# Patient Record
Sex: Female | Born: 1960 | Race: White | Hispanic: No | Marital: Single | Smoking: Former smoker
Health system: Southern US, Community
[De-identification: ages and names within clinical notes are randomized; demographics above are authoritative.]

## PROBLEM LIST (undated history)

## (undated) DIAGNOSIS — G4700 Insomnia, unspecified: Secondary | ICD-10-CM

## (undated) DIAGNOSIS — F419 Anxiety disorder, unspecified: Secondary | ICD-10-CM

## (undated) HISTORY — PX: BREAST BIOPSY: SHX20

## (undated) HISTORY — DX: Insomnia, unspecified: G47.00

---

## 2000-07-01 ENCOUNTER — Other Ambulatory Visit: Admission: RE | Admit: 2000-07-01 | Discharge: 2000-07-01 | Payer: Self-pay | Admitting: *Deleted

## 2001-05-21 ENCOUNTER — Encounter: Payer: Self-pay | Admitting: *Deleted

## 2001-05-21 ENCOUNTER — Ambulatory Visit (HOSPITAL_COMMUNITY): Admission: RE | Admit: 2001-05-21 | Discharge: 2001-05-21 | Payer: Self-pay | Admitting: *Deleted

## 2001-10-21 ENCOUNTER — Inpatient Hospital Stay (HOSPITAL_COMMUNITY): Admission: AD | Admit: 2001-10-21 | Discharge: 2001-10-23 | Payer: Self-pay | Admitting: *Deleted

## 2002-03-30 ENCOUNTER — Ambulatory Visit (HOSPITAL_COMMUNITY): Admission: RE | Admit: 2002-03-30 | Discharge: 2002-03-30 | Payer: Self-pay | Admitting: Family Medicine

## 2003-03-23 ENCOUNTER — Emergency Department (HOSPITAL_COMMUNITY): Admission: EM | Admit: 2003-03-23 | Discharge: 2003-03-23 | Payer: Self-pay | Admitting: Emergency Medicine

## 2003-04-19 ENCOUNTER — Ambulatory Visit (HOSPITAL_COMMUNITY): Admission: RE | Admit: 2003-04-19 | Discharge: 2003-04-19 | Payer: Self-pay | Admitting: Obstetrics and Gynecology

## 2004-04-23 ENCOUNTER — Ambulatory Visit (HOSPITAL_COMMUNITY): Admission: RE | Admit: 2004-04-23 | Discharge: 2004-04-23 | Payer: Self-pay | Admitting: Family Medicine

## 2005-07-22 ENCOUNTER — Ambulatory Visit (HOSPITAL_COMMUNITY): Admission: RE | Admit: 2005-07-22 | Discharge: 2005-07-22 | Payer: Self-pay | Admitting: Preventative Medicine

## 2006-02-18 ENCOUNTER — Encounter (HOSPITAL_COMMUNITY): Admission: RE | Admit: 2006-02-18 | Discharge: 2006-03-20 | Payer: Self-pay | Admitting: Preventative Medicine

## 2008-05-09 ENCOUNTER — Ambulatory Visit (HOSPITAL_COMMUNITY): Admission: RE | Admit: 2008-05-09 | Discharge: 2008-05-09 | Payer: Self-pay | Admitting: Family Medicine

## 2010-02-04 ENCOUNTER — Encounter: Payer: Self-pay | Admitting: Obstetrics and Gynecology

## 2010-06-01 NOTE — H&P (Signed)
NAME:  Sarah Juarez, Sarah Juarez                        ACCOUNT NO.:  [id number]   MEDICAL RECORD NO.:  [id number]                   PATIENT TYPE:  INP   LOCATION:  A428                                 FACILITY:  [facility]   PHYSICIAN:  Langley Gauss, MD                  DATE OF BIRTH:  [birth year]   DATE OF ADMISSION:  10/21/2001  DATE OF DISCHARGE:                                HISTORY & PHYSICAL   HISTORY:  A 50 year old gravida 3, para 2 at 39-1/[redacted] weeks gestation who was  admitted for induction of labor.  The patient is noted to live in [place] and  thus will be induced for psychosocial factors.  The patient's prenatal  course has been complicated by an abnormal AFP triple screen.  The patient  was noted to be likewise at high risk due to advanced maternal age.  She was  noted to be at 1/106 risk of Down syndrome.  She did undergo genetic  amniocentesis with final results being 46XX.  The patient was likewise noted  to be smoking about one pack per day at the onset of the pregnancy.  She was  noted to be O+ blood type and negative antibody screen.   ALLERGIES:  No known drug allergies.   PAST MEDICAL HISTORY:  She has two prior vaginal deliveries in 1984 and  1987.  She does state that she does have history of precipitous labor  lasting less than one hour.  Infant's had weighed 5 pounds and 8 ounces and  7 pounds and 5 ounces.  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   SOCIAL HISTORY:  The patient is employed at [employer].  The father  of the infant is named Engineering geologist.  The patient, as stated previously, smoked  about one pack per day at the onset of pregnancy and was noted to decrease  intake during the pregnancy.  The patient does plan on bottle feeding.  She  will be utilizing pediatrician on-call thereafter at [facility].  The  patient would like to initiate oral contraceptives for postpartum birth  control purposes.   PHYSICAL EXAMINATION:  VITAL SIGNS:  Height is 5 feet 9 inches.  Prepregnancy weight 158 pounds, recent weight 186 pounds.  Blood pressure  120/81, pulse rate of 80, respiratory rate 20.  HEENT:  Negative.  No adenopathy.  NECK:  Supple.  Thyroid is nonpalpable.  LUNGS:  Clear.  CARDIOVASCULAR:  Regular rate and rhythm.  ABDOMEN:  Soft and nontender.  No surgical scars are identified.  The  patient is vertex presentation by Leopold's maneuver.  No surgical scars are  identified.  EXTREMITIES:  Normal.  PELVIC:  Normal external genitalia.  No lesions or ulcerations identified.  No vaginal bleeding or leakage of fluids.  Cervix 2 cm dilated, 60% effaced,  -2 station with vertex presentation  confirmed.    ASSESSMENT:  A 39-1/2 week intrauterine pregnancy with favorable cervix.  We  will admit the patient on October 22, 2001, at which time we can proceed with  amniotomy and thereafter utilization of Pitocin as indicated if needed for  induction or augmentation of labor.                                               Langley Gauss, MD    DC/MEDQ  D:  10/21/2001  T:  10/22/2001  Job:  [id number]   cc:   [facility]

## 2010-06-01 NOTE — Op Note (Signed)
NAME:  Sarah Juarez, Sarah Juarez                        ACCOUNT NO.:  [id number]   MEDICAL RECORD NO.:  [id number]                   PATIENT TYPE:  INP   LOCATION:  A428                                 FACILITY:  [facility]   PHYSICIAN:  Langley Gauss, MD                  DATE OF BIRTH:  1960/04/29   DATE OF PROCEDURE:  10/22/2001  DATE OF DISCHARGE:                                 OPERATIVE REPORT   DELIVERY NOTE:  Patient admitted on 10/21/01 at 39-1/[redacted] weeks gestation  presenting in early stages of labor.  Amniotomy was performed at 2 cm  diameter, 60% effaced with the vertex at a 0 station.  The patient did have  a prior history of rapid labor and delivery.  The patient had clear amniotic  fluid.  Fetal heart rate was monitored utilizing a fetal scalp electrode.  The patient had a reassuring fetal heart rate.  Thereafter the patient  progressed rapidly along the labor curve with onset of uncomfortable uterine  contractions.  The patient requested pain medications.  She was treated with  a single dose of 10 mg of IV Nubain.  Thereafter she progressed very  rapidly.  She had stated an interest in an epidural, but she progressed very  rapidly to complete dilatation at which time I was notified.   The patient was then examined and complete dilation was confirmed.  The  patient placed in the dorsal lithotomy position and prepped and draped in  the usual sterile manner.  She pushed very well during  a short second stage  to deliver in a direct OA position.  A small midline episiotomy had been  performed.  A total of 30 cc of 1% lidocaine used in the midline of the  perineal body.  The head delivered atraumatically.  The episiotomy did not  extend.   The mouth and nares were bulb suctioned of clear amniotic fluid.  A nuchal  cord x1 with compression was reduced prior to delivery of the shoulders.  There had been noted some moderate variability decelerations during the  short second stage of labor.   After reduction of the nuchal cord there was a  spontaneous rotation to a right anterior shoulder position.  Expulsive  efforts plus gentle downward traction resulted in delivery of this shoulder  on the pubic symphysis without difficulty.  The umbilical cord was then  milked towards the infant.  The cord was doubly clamped, and cut.  The  infant was placed on the maternal abdomen for immediate bonding purposes.  A  spontaneous and vigorous breathing cry was noted.  Arterial cord gases and  cord blood are then obtained.   Gentle traction on the umbilical cord results in separation which, upon  examination, appears to be an intact placenta with an attached 3-vessel  umbilical cord.  Excellent uterine tone is achieved following delivery of  placenta.  Examination of the  genital tract reveals no lacerations.  The  midline episiotomy has not extended.  This was easily repaired utilizing #0  chromic in a running lock fascia of the vaginal mucosa followed by a 2-layer  closure of #0 chromic on the perineal body.  The patient tolerated the  delivery and repair very well.  She was then taken out of the dorsal  lithotomy position and allowed to bond with the infant.  The patient,  herself, does plan on bottle feeding this infant.                                               Langley Gauss, MD    DC/MEDQ  D:  10/22/2001  T:  10/22/2001  Job:  [id number]

## 2010-06-01 NOTE — Discharge Summary (Signed)
   NAME:  Sarah Juarez, Sarah Juarez                        ACCOUNT NO.:  [id number]   MEDICAL RECORD NO.:  [id number]                   PATIENT TYPE:  INP   LOCATION:  A428                                 FACILITY:  [facility]   PHYSICIAN:  Langley Gauss, MD                  DATE OF BIRTH:  1960-07-08   DATE OF ADMISSION:  10/21/2001  DATE OF DISCHARGE:  10/23/2001                                 DISCHARGE SUMMARY   DIAGNOSES:  A 39 week intrauterine pregnancy admitted in labor.   PROCEDURE PERFORMED:  1. October 22, 2001:  Spontaneous assisted vaginal delivery ______ pound 0     ounce female infant.  2. Midline episiotomy and repair.   ANALGESIA:  The patient received IV Nubain only during the course of labor.  She had been interested in placement of an epidural but this was not  possible or feasible due to rapid progress in active phase of labor.   Discharged on October 23, 2001.  Given a copy of standard discharge  instructions.  Follow up in the office in four weeks' time at which time she  is likely utilize Ortho-Evra patch.   DISCHARGE MEDICATIONS:  Tylox number 30 for significant perineal pain  associated with episiotomy.   The patient is bottle feeding at time of discharge.   CURRENT LABORATORY STUDIES:  Hemoglobin 12.1, hematocrit 34.9, white count  12.1.   HOSPITAL COURSE:  See previous dictations.  The patient has done well  postpartum.  Had no postpartum complications.  The patient bonded well with  the infant.  Was able to ambulate and void without difficulty.  Thus,  discharged home on October 23, 2001.  Given a copy of standardized discharge  instructions.                                               Langley Gauss, MD    DC/MEDQ  D:  10/27/2001  T:  10/28/2001  Job:  [id number]

## 2011-10-26 ENCOUNTER — Encounter (HOSPITAL_COMMUNITY): Payer: Self-pay | Admitting: *Deleted

## 2011-10-26 ENCOUNTER — Emergency Department (HOSPITAL_COMMUNITY): Payer: Self-pay

## 2011-10-26 ENCOUNTER — Emergency Department (HOSPITAL_COMMUNITY)
Admission: EM | Admit: 2011-10-26 | Discharge: 2011-10-26 | Disposition: A | Discharge status: Home / Self Care | Payer: Self-pay | Attending: Emergency Medicine | Admitting: Emergency Medicine

## 2011-10-26 DIAGNOSIS — F17200 Nicotine dependence, unspecified, uncomplicated: Secondary | ICD-10-CM | POA: Insufficient documentation

## 2011-10-26 DIAGNOSIS — W2209XA Striking against other stationary object, initial encounter: Secondary | ICD-10-CM | POA: Insufficient documentation

## 2011-10-26 DIAGNOSIS — Y92009 Unspecified place in unspecified non-institutional (private) residence as the place of occurrence of the external cause: Secondary | ICD-10-CM | POA: Insufficient documentation

## 2011-10-26 DIAGNOSIS — S92919A Unspecified fracture of unspecified toe(s), initial encounter for closed fracture: Secondary | ICD-10-CM | POA: Insufficient documentation

## 2011-10-26 DIAGNOSIS — S92401A Displaced unspecified fracture of right great toe, initial encounter for closed fracture: Secondary | ICD-10-CM

## 2011-10-26 MED ORDER — HYDROCODONE-ACETAMINOPHEN 5-325 MG PO TABS
1.0000 | ORAL_TABLET | Freq: Once | ORAL | Status: AC
  Administered 2011-10-26: 1 via ORAL
  Filled 2011-10-26: qty 1

## 2011-10-26 MED ORDER — IBUPROFEN 800 MG PO TABS
800.0000 mg | ORAL_TABLET | Freq: Once | ORAL | Status: AC
  Administered 2011-10-26: 800 mg via ORAL
  Filled 2011-10-26: qty 1

## 2011-10-26 MED ORDER — HYDROCODONE-ACETAMINOPHEN 5-325 MG PO TABS: 1.0000 | ORAL_TABLET | Freq: Four times a day (QID) | ORAL | Status: AC | PRN

## 2011-10-26 NOTE — ED Provider Notes (Signed)
History     CSN: [id number]  Arrival date & time 10/26/11  1354   None     Chief Complaint  Patient presents with  . Foot Pain    (Consider location/radiation/quality/duration/timing/severity/associated sxs/prior treatment) HPI Comments: Accidentally kicked her vacuum cleaner yest and injured R great toe.  No other injuries or complaints.  The history is provided by the patient. No language interpreter was used.    History reviewed. No pertinent past medical history.  History reviewed. No pertinent past surgical history.  History reviewed. No pertinent family history.  History  Substance Use Topics  . Smoking status: Current Every Day Smoker    Types: Cigarettes  . Smokeless tobacco: Not on file  . Alcohol Use: No    OB History    Grav Para Term Preterm Abortions TAB SAB Ect Mult Living                  Review of Systems  Musculoskeletal:       Toe injury  Skin: Negative for wound.  Neurological: Negative for numbness.  All other systems reviewed and are negative.    Allergies  Review of patient's allergies indicates not on file.  Home Medications   Current Outpatient Rx  Name Route Sig Dispense Refill  . HYDROCODONE-ACETAMINOPHEN 5-325 MG PO TABS Oral Take 1 tablet by mouth every 6 (six) hours as needed for pain. 20 tablet 0    BP 108/70  Pulse 78  Temp 98.6 F (37 C) (Oral)  Resp 20  Ht 5\' 9"  (1.753 m)  Wt 197 lb (89.359 kg)  BMI 29.09 kg/m2  SpO2 100%  Physical Exam  Nursing note and vitals reviewed. Constitutional: She is oriented to person, place, and time. She appears well-developed and well-nourished. No distress.  HENT:  Head: Normocephalic and atraumatic.  Eyes: EOM are normal.  Neck: Normal range of motion.  Cardiovascular: Normal rate, regular rhythm and normal heart sounds.   Pulmonary/Chest: Effort normal and breath sounds normal.  Abdominal: Soft. She exhibits no distension. There is no tenderness.  Musculoskeletal: She  exhibits tenderness.       Right foot: She exhibits decreased range of motion, tenderness, bony tenderness and swelling. She exhibits normal capillary refill, no crepitus, no deformity and no laceration.       Feet:  Neurological: She is alert and oriented to person, place, and time. Coordination normal.  Skin: Skin is warm and dry.  Psychiatric: She has a normal mood and affect. Judgment normal.    ED Course  Procedures (including critical care time)  Labs Reviewed - No data to display Dg Foot Complete Right  10/26/2011  *RADIOLOGY REPORT*  Clinical Data: Right great toe pain.  RIGHT FOOT COMPLETE - 3+ VIEW  Comparison: No priors.  Findings: Three views of the right foot demonstrate an oblique fracture through the medial base of the first proximal phalanx which extends to the articular surface, compatible with a mildly displaced intra-articular fracture.  Overlying soft tissues are swollen.  No other acute fracture, subluxation or dislocation is noted.  IMPRESSION: 1.  Acute mildly displaced intra-articular fracture through the medial aspect of the base of the first proximal phalanx.   Original Report Authenticated By: Florencia Reasons, M.D.      1. Fracture of great toe, right, closed       MDM  Buddy tape, post op shoe, crutches, ic and elevation. rx-hydrocodone, 20 Ibuprofen 800 mg TID        Gerlene Burdock  [place], [place] 10/26/11 1645

## 2011-10-26 NOTE — ED Notes (Signed)
Pt injured right foot by kicking vacuum cleaner yesterday morning, pt not able to put direct weight on it

## 2011-10-26 NOTE — ED Notes (Signed)
Patient with no complaints at this time. Respirations even and unlabored. Skin warm/dry. Discharge instructions reviewed with patient at this time. Patient given opportunity to voice concerns/ask questions. Patient discharged at this time and left Emergency Department with steady gait.   

## 2011-10-27 NOTE — ED Provider Notes (Signed)
Medical screening examination/treatment/procedure(s) were performed by non-physician practitioner and as supervising physician I was immediately available for consultation/collaboration.  Donnetta Hutching, MD 10/27/11 0005

## 2012-05-06 ENCOUNTER — Encounter (HOSPITAL_COMMUNITY): Payer: Self-pay

## 2012-05-06 ENCOUNTER — Emergency Department (HOSPITAL_COMMUNITY)
Admission: EM | Admit: 2012-05-06 | Discharge: 2012-05-06 | Disposition: A | Discharge status: Home / Self Care | Payer: Self-pay | Attending: Emergency Medicine | Admitting: Emergency Medicine

## 2012-05-06 DIAGNOSIS — R6884 Jaw pain: Secondary | ICD-10-CM | POA: Insufficient documentation

## 2012-05-06 DIAGNOSIS — K089 Disorder of teeth and supporting structures, unspecified: Secondary | ICD-10-CM | POA: Insufficient documentation

## 2012-05-06 DIAGNOSIS — F17200 Nicotine dependence, unspecified, uncomplicated: Secondary | ICD-10-CM | POA: Insufficient documentation

## 2012-05-06 DIAGNOSIS — K0889 Other specified disorders of teeth and supporting structures: Secondary | ICD-10-CM

## 2012-05-06 DIAGNOSIS — R220 Localized swelling, mass and lump, head: Secondary | ICD-10-CM | POA: Insufficient documentation

## 2012-05-06 MED ORDER — PENICILLIN V POTASSIUM 250 MG PO TABS
500.0000 mg | ORAL_TABLET | Freq: Once | ORAL | Status: AC
  Administered 2012-05-06: 500 mg via ORAL
  Filled 2012-05-06: qty 2

## 2012-05-06 MED ORDER — IBUPROFEN 800 MG PO TABS: 800.0000 mg | ORAL_TABLET | Freq: Three times a day (TID) | ORAL | Status: DC

## 2012-05-06 MED ORDER — HYDROCODONE-ACETAMINOPHEN 5-325 MG PO TABS
1.0000 | ORAL_TABLET | Freq: Once | ORAL | Status: AC
  Administered 2012-05-06: 1 via ORAL
  Filled 2012-05-06: qty 1

## 2012-05-06 MED ORDER — ONDANSETRON HCL 4 MG PO TABS
4.0000 mg | ORAL_TABLET | Freq: Once | ORAL | Status: AC
  Administered 2012-05-06: 4 mg via ORAL
  Filled 2012-05-06: qty 1

## 2012-05-06 MED ORDER — IBUPROFEN 800 MG PO TABS
800.0000 mg | ORAL_TABLET | Freq: Once | ORAL | Status: AC
  Administered 2012-05-06: 800 mg via ORAL
  Filled 2012-05-06: qty 1

## 2012-05-06 MED ORDER — AMOXICILLIN 500 MG PO CAPS: 500.0000 mg | ORAL_CAPSULE | Freq: Three times a day (TID) | ORAL | Status: DC

## 2012-05-06 MED ORDER — HYDROCODONE-ACETAMINOPHEN 5-325 MG PO TABS: 1.0000 | ORAL_TABLET | ORAL | Status: DC | PRN

## 2012-05-06 NOTE — ED Provider Notes (Signed)
Medical screening examination/treatment/procedure(s) were performed by non-physician practitioner and as supervising physician I was immediately available for consultation/collaboration.   Dione Booze, MD 05/06/12 2041

## 2012-05-06 NOTE — ED Notes (Signed)
nad noted prior to dc. 3 scripts given to pt. Ambulated out without difficulty. Dc instructions reviewed and voiced understanding

## 2012-05-06 NOTE — ED Provider Notes (Signed)
History     CSN: [id number]  Arrival date & time 05/06/12  1741   First MD Initiated Contact with Patient 05/06/12 1949      Chief Complaint  Patient presents with  . Dental Pain    (Consider location/radiation/quality/duration/timing/severity/associated sxs/prior treatment) Patient is a 52 y.o. female presenting with tooth pain. The history is provided by the patient.  Dental PainThe primary symptoms include mouth pain. Primary symptoms do not include shortness of breath or cough. The symptoms began 6 to 12 hours ago. The symptoms are worsening. The symptoms occur frequently.  Additional symptoms include: gum swelling and jaw pain. Additional symptoms do not include: trouble swallowing and nosebleeds. Medical issues include: smoking.    History reviewed. No pertinent past medical history.  History reviewed. No pertinent past surgical history.  No family history on file.  History  Substance Use Topics  . Smoking status: Current Every Day Smoker    Types: Cigarettes  . Smokeless tobacco: Not on file  . Alcohol Use: No    OB History   Grav Para Term Preterm Abortions TAB SAB Ect Mult Living                  Review of Systems  Constitutional: Negative for activity change.       All ROS Neg except as noted in HPI  HENT: Positive for dental problem. Negative for nosebleeds, trouble swallowing and neck pain.   Eyes: Negative for photophobia and discharge.  Respiratory: Negative for cough, shortness of breath and wheezing.   Cardiovascular: Negative for chest pain and palpitations.  Gastrointestinal: Negative for abdominal pain and blood in stool.  Genitourinary: Negative for dysuria, frequency and hematuria.  Musculoskeletal: Negative for back pain and arthralgias.  Skin: Negative.   Neurological: Negative for dizziness, seizures and speech difficulty.  Psychiatric/Behavioral: Negative for hallucinations and confusion.    Allergies  Review of patient's allergies  indicates no known allergies.  Home Medications  No current outpatient prescriptions on file.  BP 131/76  Pulse 79  Temp(Src) 97.2 F (36.2 C) (Oral)  Resp 18  Ht 5\' 9"  (1.753 m)  Wt 188 lb (85.276 kg)  BMI 27.75 kg/m2  SpO2 99%  Physical Exam  Nursing note and vitals reviewed. Constitutional: She is oriented to person, place, and time. She appears well-developed and well-nourished.  Non-toxic appearance.  HENT:  Head: Normocephalic.  Right Ear: Tympanic membrane and external ear normal.  Left Ear: Tympanic membrane and external ear normal.  Mouth/Throat:    Airway patent. No swelling under the tongue.  Eyes: EOM and lids are normal. Pupils are equal, round, and reactive to light.  Neck: Normal range of motion. Neck supple. Carotid bruit is not present.  Cardiovascular: Normal rate, regular rhythm, normal heart sounds, intact distal pulses and normal pulses.   Pulmonary/Chest: Breath sounds normal. No respiratory distress.  Abdominal: Soft. Bowel sounds are normal. There is no tenderness. There is no guarding.  Musculoskeletal: Normal range of motion.  Lymphadenopathy:       Head (right side): No submandibular adenopathy present.       Head (left side): No submandibular adenopathy present.    She has no cervical adenopathy.  Neurological: She is alert and oriented to person, place, and time. She has normal strength. No cranial nerve deficit or sensory deficit.  Skin: Skin is warm and dry.  Psychiatric: She has a normal mood and affect. Her speech is normal.    ED Course  Procedures (including critical  care time)  Labs Reviewed - No data to display No results found. Pulse oximetry 99% on room air. Within normal limits by my interpretation.  No diagnosis found.    MDM  I have reviewed nursing notes, vital signs, and all appropriate lab and imaging results for this patient. Patient reports having had a deep cavity on the left lower jaw for some time. Today she  awakened with the gums swollen facial swelling and tenderness. She denies any high fever.  The plan at this time is for the patient be placed on Amoxil 3 times daily, ibuprofen 3 times daily, and Norco every 4 hours as needed for pain #20 tablets. Patient strongly encouraged to see a dentist as sone as possible.       Kathie Dike, PA-C 05/06/12 2019

## 2012-05-06 NOTE — ED Notes (Signed)
Pt reports woke up this morning with swelling to left lower jaw and pain.

## 2013-08-06 ENCOUNTER — Other Ambulatory Visit: Payer: Self-pay | Admitting: *Deleted

## 2014-06-08 ENCOUNTER — Encounter (INDEPENDENT_AMBULATORY_CARE_PROVIDER_SITE_OTHER): Payer: Self-pay | Admitting: *Deleted

## 2014-06-23 ENCOUNTER — Other Ambulatory Visit (INDEPENDENT_AMBULATORY_CARE_PROVIDER_SITE_OTHER): Payer: Self-pay | Admitting: *Deleted

## 2014-06-23 ENCOUNTER — Encounter (INDEPENDENT_AMBULATORY_CARE_PROVIDER_SITE_OTHER): Payer: Self-pay | Admitting: *Deleted

## 2014-06-23 ENCOUNTER — Other Ambulatory Visit (HOSPITAL_COMMUNITY): Payer: Self-pay | Admitting: Family Medicine

## 2014-06-23 DIAGNOSIS — Z1211 Encounter for screening for malignant neoplasm of colon: Secondary | ICD-10-CM

## 2014-06-23 DIAGNOSIS — Z1231 Encounter for screening mammogram for malignant neoplasm of breast: Secondary | ICD-10-CM

## 2014-07-11 ENCOUNTER — Ambulatory Visit (HOSPITAL_COMMUNITY): Payer: Self-pay

## 2014-07-13 ENCOUNTER — Ambulatory Visit (HOSPITAL_COMMUNITY)
Admission: RE | Admit: 2014-07-13 | Discharge: 2014-07-13 | Disposition: A | Discharge status: Home / Self Care | Payer: 59 | Source: Ambulatory Visit | Attending: Family Medicine | Admitting: Family Medicine

## 2014-07-13 DIAGNOSIS — Z1231 Encounter for screening mammogram for malignant neoplasm of breast: Secondary | ICD-10-CM | POA: Insufficient documentation

## 2014-07-14 ENCOUNTER — Other Ambulatory Visit: Payer: Self-pay | Admitting: Family Medicine

## 2014-07-14 DIAGNOSIS — R928 Other abnormal and inconclusive findings on diagnostic imaging of breast: Secondary | ICD-10-CM

## 2014-07-26 ENCOUNTER — Other Ambulatory Visit: Payer: Self-pay | Admitting: Diagnostic Radiology

## 2014-07-26 ENCOUNTER — Other Ambulatory Visit (HOSPITAL_COMMUNITY): Payer: Self-pay | Admitting: Internal Medicine

## 2014-07-26 ENCOUNTER — Other Ambulatory Visit: Payer: Self-pay | Admitting: Family Medicine

## 2014-07-26 ENCOUNTER — Ambulatory Visit (HOSPITAL_COMMUNITY)
Admission: RE | Admit: 2014-07-26 | Discharge: 2014-07-26 | Disposition: A | Discharge status: Home / Self Care | Payer: 59 | Source: Ambulatory Visit | Attending: Internal Medicine | Admitting: Internal Medicine

## 2014-07-26 ENCOUNTER — Ambulatory Visit (HOSPITAL_COMMUNITY)
Admission: RE | Admit: 2014-07-26 | Discharge: 2014-07-26 | Disposition: A | Discharge status: Home / Self Care | Payer: 59 | Source: Ambulatory Visit | Attending: Family Medicine | Admitting: Family Medicine

## 2014-07-26 DIAGNOSIS — R921 Mammographic calcification found on diagnostic imaging of breast: Secondary | ICD-10-CM

## 2014-07-26 DIAGNOSIS — R928 Other abnormal and inconclusive findings on diagnostic imaging of breast: Secondary | ICD-10-CM | POA: Insufficient documentation

## 2014-07-26 DIAGNOSIS — N6320 Unspecified lump in the left breast, unspecified quadrant: Secondary | ICD-10-CM

## 2014-07-26 DIAGNOSIS — N6310 Unspecified lump in the right breast, unspecified quadrant: Secondary | ICD-10-CM

## 2014-07-26 DIAGNOSIS — N63 Unspecified lump in breast: Secondary | ICD-10-CM | POA: Insufficient documentation

## 2014-08-15 ENCOUNTER — Telehealth (INDEPENDENT_AMBULATORY_CARE_PROVIDER_SITE_OTHER): Payer: Self-pay | Admitting: *Deleted

## 2014-08-15 NOTE — Telephone Encounter (Signed)
Patient needs suprep 

## 2014-08-16 MED ORDER — SUPREP BOWEL PREP KIT 17.5-3.13-1.6 GM/177ML PO SOLN: 1.0000 | Freq: Once | ORAL | Status: DC

## 2014-09-09 ENCOUNTER — Telehealth (INDEPENDENT_AMBULATORY_CARE_PROVIDER_SITE_OTHER): Payer: Self-pay | Admitting: *Deleted

## 2014-09-09 NOTE — Telephone Encounter (Signed)
Referring MD/PCP: fusco   Procedure: tcs  Reason/Indication:  screening  Has patient had this procedure before?  no  If so, when, by whom and where?    Is there a family history of colon cancer?  no  Who?  What age when diagnosed?    Is patient diabetic?   no      Does patient have prosthetic heart valve?  no  Do you have a pacemaker?  no  Has patient ever had endocarditis? no  Has patient had joint replacement within last 12 months?  no  Does patient tend to be constipated or take laxatives? no  Does patient have a history of alcohol/drug use? no  Is patient on Coumadin, Plavix and/or Aspirin? no  Medications: simvastatin 20 mg daily, fish oil daily  Allergies: nkda  Medication Adjustment:   Procedure date & time: 10/06/14 at 1200

## 2014-09-13 ENCOUNTER — Ambulatory Visit
Admission: RE | Admit: 2014-09-13 | Discharge: 2014-09-13 | Disposition: A | Discharge status: Home / Self Care | Payer: 59 | Source: Ambulatory Visit | Attending: Family Medicine | Admitting: Family Medicine

## 2014-09-13 DIAGNOSIS — R921 Mammographic calcification found on diagnostic imaging of breast: Secondary | ICD-10-CM

## 2014-09-15 NOTE — Telephone Encounter (Signed)
agree

## 2014-10-06 ENCOUNTER — Encounter (HOSPITAL_COMMUNITY): Admission: RE | Payer: Self-pay | Source: Ambulatory Visit

## 2014-10-06 ENCOUNTER — Ambulatory Visit (HOSPITAL_COMMUNITY): Admission: RE | Admit: 2014-10-06 | Payer: 59 | Source: Ambulatory Visit | Admitting: Internal Medicine

## 2014-10-06 SURGERY — COLONOSCOPY
Anesthesia: Moderate Sedation

## 2014-12-06 ENCOUNTER — Other Ambulatory Visit: Payer: Self-pay | Admitting: Adult Health

## 2014-12-06 ENCOUNTER — Encounter: Payer: Self-pay | Admitting: Adult Health

## 2015-02-17 ENCOUNTER — Other Ambulatory Visit: Payer: Self-pay | Admitting: Surgery

## 2015-02-17 DIAGNOSIS — N6022 Fibroadenosis of left breast: Secondary | ICD-10-CM

## 2016-01-15 HISTORY — PX: NECK SURGERY: SHX720

## 2016-03-21 ENCOUNTER — Other Ambulatory Visit (HOSPITAL_COMMUNITY): Payer: Self-pay | Admitting: Family Medicine

## 2016-03-21 ENCOUNTER — Ambulatory Visit (HOSPITAL_COMMUNITY)
Admission: RE | Admit: 2016-03-21 | Discharge: 2016-03-21 | Disposition: A | Discharge status: Home / Self Care | Payer: BC Managed Care – PPO | Source: Ambulatory Visit | Attending: Family Medicine | Admitting: Family Medicine

## 2016-03-21 DIAGNOSIS — M25571 Pain in right ankle and joints of right foot: Secondary | ICD-10-CM | POA: Diagnosis present

## 2016-04-08 ENCOUNTER — Other Ambulatory Visit (HOSPITAL_COMMUNITY): Payer: Self-pay | Admitting: Family Medicine

## 2016-04-08 DIAGNOSIS — R921 Mammographic calcification found on diagnostic imaging of breast: Secondary | ICD-10-CM

## 2016-04-08 DIAGNOSIS — N6320 Unspecified lump in the left breast, unspecified quadrant: Secondary | ICD-10-CM

## 2016-04-08 DIAGNOSIS — N6310 Unspecified lump in the right breast, unspecified quadrant: Secondary | ICD-10-CM

## 2016-04-11 ENCOUNTER — Other Ambulatory Visit (HOSPITAL_COMMUNITY): Payer: Self-pay | Admitting: Pulmonary Disease

## 2016-04-14 HISTORY — PX: CERVICAL SPINE SURGERY: SHX589

## 2016-04-23 ENCOUNTER — Encounter (HOSPITAL_COMMUNITY): Payer: BC Managed Care – PPO

## 2016-05-14 ENCOUNTER — Encounter (HOSPITAL_COMMUNITY): Payer: BC Managed Care – PPO

## 2016-06-04 ENCOUNTER — Ambulatory Visit (HOSPITAL_COMMUNITY)
Admission: RE | Admit: 2016-06-04 | Discharge: 2016-06-04 | Disposition: A | Discharge status: Home / Self Care | Payer: BC Managed Care – PPO | Source: Ambulatory Visit | Attending: Family Medicine | Admitting: Family Medicine

## 2016-06-04 DIAGNOSIS — R921 Mammographic calcification found on diagnostic imaging of breast: Secondary | ICD-10-CM | POA: Diagnosis present

## 2016-06-04 DIAGNOSIS — N6320 Unspecified lump in the left breast, unspecified quadrant: Secondary | ICD-10-CM

## 2016-06-04 DIAGNOSIS — N6310 Unspecified lump in the right breast, unspecified quadrant: Secondary | ICD-10-CM

## 2016-07-26 ENCOUNTER — Other Ambulatory Visit: Payer: Self-pay | Admitting: Family Medicine

## 2016-07-26 DIAGNOSIS — R921 Mammographic calcification found on diagnostic imaging of breast: Secondary | ICD-10-CM

## 2016-08-02 ENCOUNTER — Ambulatory Visit
Admission: RE | Admit: 2016-08-02 | Discharge: 2016-08-02 | Disposition: A | Discharge status: Home / Self Care | Payer: BC Managed Care – PPO | Source: Ambulatory Visit | Attending: Family Medicine | Admitting: Family Medicine

## 2016-08-02 DIAGNOSIS — R921 Mammographic calcification found on diagnostic imaging of breast: Secondary | ICD-10-CM

## 2016-10-16 ENCOUNTER — Other Ambulatory Visit: Payer: Self-pay | Admitting: Surgery

## 2016-11-11 ENCOUNTER — Telehealth: Payer: Self-pay

## 2016-11-11 NOTE — Telephone Encounter (Signed)
[phone] PATIENT RECEIVED LETTER TO SCHEDULE TCS, NO GI ISSUES OR HEART ISSUES, NO BLOOD THINNERS

## 2016-11-18 ENCOUNTER — Telehealth: Payer: Self-pay

## 2016-11-18 NOTE — Telephone Encounter (Signed)
LMOM for pt to call tomorrow.

## 2016-11-18 NOTE — Telephone Encounter (Signed)
See separate note.

## 2016-11-18 NOTE — Telephone Encounter (Signed)
Pt called again today to be triaged. She said that she had left a message a week ago and hasn't heard back from [place]. I told her that the triage nurse was out last week and there was a note for her to call. 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 Please call patient before then if possible. [phone]

## 2016-11-20 ENCOUNTER — Telehealth: Payer: Self-pay

## 2016-11-20 NOTE — Telephone Encounter (Signed)
See separate triage.  

## 2016-11-27 NOTE — Telephone Encounter (Signed)
Gastroenterology Pre-Procedure Review  Request Date: 11/20/2016 Requesting Physician: Dr. Hilma Favors  PATIENT REVIEW QUESTIONS: The patient responded to the following health history questions as indicated:    1. Diabetes Melitis: no 2. Joint replacements in the past 12 months: no 3. Major health problems in the past 3 months: no 4. Has an artificial valve or MVP: no 5. Has a defibrillator: no 6. Has been advised in past to take antibiotics in advance of a procedure like teeth cleaning: no 7. Family history of colon cancer: no  8. Alcohol Use: no 9. History of sleep apnea: no  10. History of coronary artery or other vascular stents placed within the last 12 months: no 11. History of any prior anesthesia complications: no    MEDICATIONS & ALLERGIES:    Patient reports the following regarding taking any blood thinners:   Plavix? no Aspirin? no Coumadin? no Brilinta? no Xarelto? no Eliquis? no Pradaxa? no Savaysa? no Effient? no  Patient confirms/reports the following medications:  Current Outpatient Medications  Medication Sig Dispense Refill  . diazepam (VALIUM) 5 MG tablet Take 5 mg every 6 (six) hours as needed by mouth for anxiety (Takes maybe once a week).    Marland Kitchen zolpidem (AMBIEN) 10 MG tablet Take 10 mg by mouth.    Marland Kitchen ibuprofen (ADVIL,MOTRIN) 800 MG tablet Take 1 tablet (800 mg total) by mouth 3 (three) times daily. (Patient not taking: Reported on 11/20/2016) 21 tablet 0  . SUPREP BOWEL PREP SOLN Take 1 kit by mouth once. (Patient not taking: Reported on 11/20/2016) 1 Bottle 0   No current facility-administered medications for this visit.     Patient confirms/reports the following allergies:  No Known Allergies  No orders of the defined types were placed in this encounter.   AUTHORIZATION INFORMATION Primary Insurance:   ID #:  Group #:  Pre-Cert / Auth required:  Pre-Cert / Auth #:   Secondary Insurance:  ID #:   Group #:  Pre-Cert / Auth required:  Pre-Cert / Auth  #:   SCHEDULE INFORMATION: Procedure has been scheduled as follows:  Date:                      Time:   Location:   This Gastroenterology Pre-Precedure Review Form is being routed to the following provider(s):

## 2016-11-28 NOTE — Telephone Encounter (Signed)
Will need OV to consider augmented sedation due to polypharmacy.

## 2016-12-02 NOTE — Telephone Encounter (Signed)
Ov with Walden Field, NP on 01/27/2017 at 11:30 AM.

## 2017-01-27 ENCOUNTER — Ambulatory Visit: Payer: BC Managed Care – PPO | Admitting: Nurse Practitioner

## 2017-03-04 ENCOUNTER — Ambulatory Visit: Payer: BC Managed Care – PPO | Admitting: Nurse Practitioner

## 2017-04-07 ENCOUNTER — Encounter: Payer: Self-pay | Admitting: Nurse Practitioner

## 2017-04-07 ENCOUNTER — Ambulatory Visit: Payer: BC Managed Care – PPO | Admitting: Nurse Practitioner

## 2017-04-07 ENCOUNTER — Telehealth: Payer: Self-pay

## 2017-04-07 ENCOUNTER — Other Ambulatory Visit: Payer: Self-pay

## 2017-04-07 DIAGNOSIS — Z1211 Encounter for screening for malignant neoplasm of colon: Secondary | ICD-10-CM

## 2017-04-07 DIAGNOSIS — Z720 Tobacco use: Secondary | ICD-10-CM

## 2017-04-07 DIAGNOSIS — R69 Illness, unspecified: Secondary | ICD-10-CM | POA: Diagnosis not present

## 2017-04-07 MED ORDER — PEG 3350-KCL-NA BICARB-NACL 420 G PO SOLR: 4000.0000 mL | ORAL | 0 refills | Status: DC

## 2017-04-07 NOTE — Progress Notes (Addendum)
REVIEWED-NO ADDITIONAL RECOMMENDATIONS.  Primary Care Physician:  Redmond School, MD Primary Gastroenterologist:  Dr. Oneida Alar  Chief Complaint  Patient presents with  . other    schedule colonoscopy     HPI:   Sarah Juarez is a 57 y.o. female who presents after colonoscopy.  Apparently the patient was triaged for colonoscopy with Dr. Laural Golden in 2016 but the colonoscopy was canceled.  The patient received a letter from our office in 2018 indicating need for colonoscopy this time primary care recommendations.  Recommended office visit due to likely need for augmented sedation.  The patient canceled her office visit in January 2019 and was rescheduled to today.  No history of colonoscopy or endoscopy in our system.  Today she states she's doing well overall. Has never had a colonoscopy before. Denies abdominal pain, N/V, melena, fever, chills, unintentional weight loss. Has "every once in a blue moon " hematochezia in the toilet. Thinks it's related to hemorrhoids. Does have hemorrhoid symptoms when she has bleeding (itching, irritation, fullness.) Uses PreparationH as needed, which is effective for her. Denies chest pain, dyspnea, dizziness, lightheadedness, syncope, near syncope. Denies any other upper or lower GI symptoms.  Past Medical History:  Diagnosis Date  . Insomnia     Past Surgical History:  Procedure Laterality Date  . CERVICAL SPINE SURGERY  04/2016    Current Outpatient Medications  Medication Sig Dispense Refill  . diazepam (VALIUM) 5 MG tablet Take 5 mg every 6 (six) hours as needed by mouth for anxiety (Takes maybe once a week).    Marland Kitchen zolpidem (AMBIEN) 10 MG tablet Take 10 mg by mouth.    Manus Gunning BOWEL PREP SOLN Take 1 kit by mouth once. (Patient not taking: Reported on 11/20/2016) 1 Bottle 0   No current facility-administered medications for this visit.     Allergies as of 04/07/2017  . (No Known Allergies)    Family History  Problem Relation Age of Onset   . Colon cancer Neg Hx   . Gastric cancer Neg Hx   . Esophageal cancer Neg Hx     Social History   Socioeconomic History  . Marital status: Single    Spouse name: Not on file  . Number of children: Not on file  . Years of education: Not on file  . Highest education level: Not on file  Occupational History  . Not on file  Social Needs  . Financial resource strain: Not on file  . Food insecurity:    Worry: Not on file    Inability: Not on file  . Transportation needs:    Medical: Not on file    Non-medical: Not on file  Tobacco Use  . Smoking status: Current Every Day Smoker    Packs/day: 0.50    Types: Cigarettes  . Smokeless tobacco: Never Used  . Tobacco comment: less than a pack daily  Substance and Sexual Activity  . Alcohol use: No  . Drug use: No  . Sexual activity: Not on file  Lifestyle  . Physical activity:    Days per week: Not on file    Minutes per session: Not on file  . Stress: Not on file  Relationships  . Social connections:    Talks on phone: Not on file    Gets together: Not on file    Attends religious service: Not on file    Active member of club or organization: Not on file    Attends meetings of clubs or organizations:  Not on file    Relationship status: Not on file  . Intimate partner violence:    Fear of current or ex partner: Not on file    Emotionally abused: Not on file    Physically abused: Not on file    Forced sexual activity: Not on file  Other Topics Concern  . Not on file  Social History Narrative  . Not on file    Review of Systems: Complete ROS negative except as per HPI.    Physical Exam: BP 126/78   Pulse 69   Temp 97.8 F (36.6 C) (Oral)   Ht _0  (1.753 m)   Wt 214 lb 3.2 oz (97.2 kg)   BMI 31.63 kg/m  General:   Alert and oriented. Pleasant and cooperative. Well-nourished and well-developed.  Head:  Normocephalic and atraumatic. Eyes:  Without icterus, sclera clear and conjunctiva pink.  Ears:  Normal  auditory acuity. Cardiovascular:  S1, S2 present without murmurs appreciated. Extremities without clubbing or edema. Respiratory:  Clear to auscultation bilaterally. No wheezes, rales, or rhonchi. No distress.  Gastrointestinal:  +BS, soft, non-tender and non-distended. No HSM noted. No guarding or rebound. No masses appreciated.  Rectal:  Deferred  Musculoskalatal:  Symmetrical without gross deformities. Neurologic:  Alert and oriented x4;  grossly normal neurologically. Psych:  Alert and cooperative. Normal mood and affect. Heme/Lymph/Immune: No excessive bruising noted.    04/07/2017 12:24 PM   Disclaimer: This note was dictated with voice recognition software. Similar sounding words can inadvertently be transcribed and may not be corrected upon review.

## 2017-04-07 NOTE — Assessment & Plan Note (Signed)
The patient currently smokes.  She indicates she wants to quit.  I recommended she follow-up with her primary care for medical assistance in quitting smoking.  I will provide her with resources such as the [place] quit line.  Follow-up as needed.

## 2017-04-07 NOTE — Telephone Encounter (Signed)
Called and informed pt of pre-op appt 05/28/17 at 10:00am. Letter mailed.

## 2017-04-07 NOTE — Patient Instructions (Addendum)
1. Below is information related to the [place] quit line. 2. We will schedule your colonoscopy for you. 3. Further recommendations will be made after your colonoscopy. 4. Follow-up based on the recommendations made after your colonoscopy. 5. Call us if you have any questions or concerns.    Quitline [place]:  [email or web address]     It was good meeting you today. Enjoy the sun and warmth!!!    At [facility] we value your feedback. You may receive a survey about your visit today. Please share your experience as we strive to create trusing relationships with our patients to provide genuine, compassionate, quality care.

## 2017-04-07 NOTE — Assessment & Plan Note (Signed)
The patient is currently overdue for her first ever colonoscopy.  She was brought into the office due to chronic medications which could necessitate augmented sedation.  She is currently on Valium every 6 hours as needed as well as Ambien for insomnia.  No other obvious contraindications to colonoscopy.  At this point we will proceed.  Proceed with colonoscopy propofol/MAC with Dr. Oneida Alar in the near future. The risks, benefits, and alternatives have been discussed in detail with the patient. They state understanding and desire to proceed.   Patient is currently on Valium and Ambien.  No other anticoagulants, anxiolytics, chronic pain medications, or antidepressants.  In general, no other medications.  We will plan for the procedure on propofol/MAC to promote adequate sedation.

## 2017-05-15 ENCOUNTER — Other Ambulatory Visit: Payer: Self-pay | Admitting: Family Medicine

## 2017-05-15 DIAGNOSIS — Z1231 Encounter for screening mammogram for malignant neoplasm of breast: Secondary | ICD-10-CM

## 2017-05-22 NOTE — Patient Instructions (Signed)
Sarah Juarez  05/22/2017     @PREFPERIOPPHARMACY @   Your procedure is scheduled on  06/03/2017   Report to [facility] at  74  A.M.  Call this number if you have problems the morning of surgery:  [phone]   Remember:  Do not eat food or drink liquids after midnight.  Take these medicines the morning of surgery with A SIP OF WATER  Valium.   Do not wear jewelry, make-up or nail polish.  Do not wear lotions, powders, or perfumes, or deodorant.  Do not shave 48 hours prior to surgery.  Men may shave face and neck.  Do not bring valuables to the hospital.  [facility] is not responsible for any belongings or valuables.  Contacts, dentures or bridgework may not be worn into surgery.  Leave your suitcase in the car.  After surgery it may be brought to your room.  For patients admitted to the hospital, discharge time will be determined by your treatment team.  Patients discharged the day of surgery will not be allowed to drive home.   Name and phone number of your driver:   family Special instructions:  Follow the diet and prep instructions given to you by Dr Nona Dell office.  Please read over the following fact sheets that you were given. Anesthesia Post-op Instructions and Care and Recovery After Surgery       Colonoscopy, Adult A colonoscopy is an exam to look at the large intestine. It is done to check for problems, such as:  Lumps (tumors).  Growths (polyps).  Swelling (inflammation).  Bleeding.  What happens before the procedure? Eating and drinking Follow instructions from your doctor about eating and drinking. These instructions may include:  A few days before the procedure - follow a low-fiber diet. ? Avoid nuts. ? Avoid seeds. ? Avoid dried fruit. ? Avoid raw fruits. ? Avoid vegetables.  1-3 days before the procedure - follow a clear liquid diet. Avoid liquids that have red or purple dye. Drink only clear liquids, such as: ? Clear  broth or bouillon. ? Black coffee or tea. ? Clear juice. ? Clear soft drinks or sports drinks. ? Gelatin dessert. ? Popsicles.  On the day of the procedure - do not eat or drink anything during the 2 hours before the procedure.  Bowel prep If you were prescribed an oral bowel prep:  Take it as told by your doctor. Starting the day before your procedure, you will need to drink a lot of liquid. The liquid will cause you to poop (have bowel movements) until your poop is almost clear or light green.  If your skin or butt gets irritated from diarrhea, you may: ? Wipe the area with wipes that have medicine in them, such as adult wet wipes with aloe and vitamin E. ? Put something on your skin that soothes the area, such as petroleum jelly.  If you throw up (vomit) while drinking the bowel prep, take a break for up to 60 minutes. Then begin the bowel prep again. If you keep throwing up and you cannot take the bowel prep without throwing up, call your doctor.  General instructions  Ask your doctor about changing or stopping your normal medicines. This is important if you take diabetes medicines or blood thinners.  Plan to have someone take you home from the hospital or clinic. What happens during the procedure?  An IV tube may be put  into one of your veins.  You will be given medicine to help you relax (sedative).  To reduce your risk of infection: ? Your doctors will wash their hands. ? Your anal area will be washed with soap.  You will be asked to lie on your side with your knees bent.  Your doctor will get a long, thin, flexible tube ready. The tube will have a camera and a light on the end.  The tube will be put into your anus.  The tube will be gently put into your large intestine.  Air will be delivered into your large intestine to keep it open. You may feel some pressure or cramping.  The camera will be used to take photos.  A small tissue sample may be removed from your  body to be looked at under a microscope (biopsy). If any possible problems are found, the tissue will be sent to a lab for testing.  If small growths are found, your doctor may remove them and have them checked for cancer.  The tube that was put into your anus will be slowly removed. The procedure may vary among doctors and hospitals. What happens after the procedure?  Your doctor will check on you often until the medicines you were given have worn off.  Do not drive for 24 hours after the procedure.  You may have a small amount of blood in your poop.  You may pass gas.  You may have mild cramps or bloating in your belly (abdomen).  It is up to you to get the results of your procedure. Ask your doctor, or the department performing the procedure, when your results will be ready. This information is not intended to replace advice given to you by your health care provider. Make sure you discuss any questions you have with your health care provider. Document Released: 02/02/2010 Document Revised: 11/01/2015 Document Reviewed: 03/14/2015 Elsevier Interactive Patient Education  2017 Elsevier Inc[employer]  Colonoscopy, Adult, Care After This sheet gives you information about how to care for yourself after your procedure. Your health care provider may also give you more specific instructions. If you have problems or questions, contact your health care provider. What can I expect after the procedure? After the procedure, it is common to have:  A small amount of blood in your stool for 24 hours after the procedure.  Some gas.  Mild abdominal cramping or bloating.  Follow these instructions at home: General instructions   For the first 24 hours after the procedure: ? Do not drive or use machinery. ? Do not sign important documents. ? Do not drink alcohol. ? Do your regular daily activities at a slower pace than normal. ? Eat soft, easy-to-digest foods. ? Rest often.  Take over-the-counter  or prescription medicines only as told by your health care provider.  It is up to you to get the results of your procedure. Ask your health care provider, or the department performing the procedure, when your results will be ready. Relieving cramping and bloating  Try walking around when you have cramps or feel bloated.  Apply heat to your abdomen as told by your health care provider. Use a heat source that your health care provider recommends, such as a moist heat pack or a heating pad. ? Place a towel between your skin and the heat source. ? Leave the heat on for 20-30 minutes. ? Remove the heat if your skin turns bright red. This is especially important if you are unable  to feel pain, heat, or cold. You may have a greater risk of getting burned. Eating and drinking  Drink enough fluid to keep your urine clear or pale yellow.  Resume your normal diet as instructed by your health care provider. Avoid heavy or fried foods that are hard to digest.  Avoid drinking alcohol for as long as instructed by your health care provider. Contact a health care provider if:  You have blood in your stool 2-3 days after the procedure. Get help right away if:  You have more than a small spotting of blood in your stool.  You pass large blood clots in your stool.  Your abdomen is swollen.  You have nausea or vomiting.  You have a fever.  You have increasing abdominal pain that is not relieved with medicine. This information is not intended to replace advice given to you by your health care provider. Make sure you discuss any questions you have with your health care provider. Document Released: 08/15/2003 Document Revised: 09/25/2015 Document Reviewed: 03/14/2015 Elsevier Interactive Patient Education  2018 [place] Anesthesia is a term that refers to techniques, procedures, and medicines that help a person stay safe and comfortable during a medical procedure.  Monitored anesthesia care, or sedation, is one type of anesthesia. Your anesthesia specialist may recommend sedation if you will be having a procedure that does not require you to be unconscious, such as:  Cataract surgery.  A dental procedure.  A biopsy.  A colonoscopy.  During the procedure, you may receive a medicine to help you relax (sedative). There are three levels of sedation:  Mild sedation. At this level, you may feel awake and relaxed. You will be able to follow directions.  Moderate sedation. At this level, you will be sleepy. You may not remember the procedure.  Deep sedation. At this level, you will be asleep. You will not remember the procedure.  The more medicine you are given, the deeper your level of sedation will be. Depending on how you respond to the procedure, the anesthesia specialist may change your level of sedation or the type of anesthesia to fit your needs. An anesthesia specialist will monitor you closely during the procedure. Let your health care provider know about:  Any allergies you have.  All medicines you are taking, including vitamins, herbs, eye drops, creams, and over-the-counter medicines.  Any use of steroids (by mouth or as a cream).  Any problems you or family members have had with sedatives and anesthetic medicines.  Any blood disorders you have.  Any surgeries you have had.  Any medical conditions you have, such as sleep apnea.  Whether you are pregnant or may be pregnant.  Any use of cigarettes, alcohol, or street drugs. What are the risks? Generally, this is a safe procedure. However, problems may occur, including:  Getting too much medicine (oversedation).  Nausea.  Allergic reaction to medicines.  Trouble breathing. If this happens, a breathing tube may be used to help with breathing. It will be removed when you are awake and breathing on your own.  Heart trouble.  Lung trouble.  Before the procedure Staying  hydrated Follow instructions from your health care provider about hydration, which may include:  Up to 2 hours before the procedure - you may continue to drink clear liquids, such as water, clear fruit juice, black coffee, and plain tea.  Eating and drinking restrictions Follow instructions from your health care provider about eating and drinking, which may  include:  8 hours before the procedure - stop eating heavy meals or foods such as meat, fried foods, or fatty foods.  6 hours before the procedure - stop eating light meals or foods, such as toast or cereal.  6 hours before the procedure - stop drinking milk or drinks that contain milk.  2 hours before the procedure - stop drinking clear liquids.  Medicines Ask your health care provider about:  Changing or stopping your regular medicines. This is especially important if you are taking diabetes medicines or blood thinners.  Taking medicines such as aspirin and ibuprofen. These medicines can thin your blood. Do not take these medicines before your procedure if your health care provider instructs you not to.  Tests and exams  You will have a physical exam.  You may have blood tests done to show: ? How well your kidneys and liver are working. ? How well your blood can clot.  General instructions  Plan to have someone take you home from the hospital or clinic.  If you will be going home right after the procedure, plan to have someone with you for 24 hours.  What happens during the procedure?  Your blood pressure, heart rate, breathing, level of pain and overall condition will be monitored.  An IV tube will be inserted into one of your veins.  Your anesthesia specialist will give you medicines as needed to keep you comfortable during the procedure. This may mean changing the level of sedation.  The procedure will be performed. After the procedure  Your blood pressure, heart rate, breathing rate, and blood oxygen level  will be monitored until the medicines you were given have worn off.  Do not drive for 24 hours if you received a sedative.  You may: ? Feel sleepy, clumsy, or nauseous. ? Feel forgetful about what happened after the procedure. ? Have a sore throat if you had a breathing tube during the procedure. ? Vomit. This information is not intended to replace advice given to you by your health care provider. Make sure you discuss any questions you have with your health care provider. Document Released: 09/26/2004 Document Revised: 06/09/2015 Document Reviewed: 04/23/2015 Elsevier Interactive Patient Education  2018 [place], Care After These instructions provide you with information about caring for yourself after your procedure. Your health care provider may also give you more specific instructions. Your treatment has been planned according to current medical practices, but problems sometimes occur. Call your health care provider if you have any problems or questions after your procedure. What can I expect after the procedure? After your procedure, it is common to:  Feel sleepy for several hours.  Feel clumsy and have poor balance for several hours.  Feel forgetful about what happened after the procedure.  Have poor judgment for several hours.  Feel nauseous or vomit.  Have a sore throat if you had a breathing tube during the procedure.  Follow these instructions at home: For at least 24 hours after the procedure:   Do not: ? Participate in activities in which you could fall or become injured. ? Drive. ? Use heavy machinery. ? Drink alcohol. ? Take sleeping pills or medicines that cause drowsiness. ? Make important decisions or sign legal documents. ? Take care of children on your own.  Rest. Eating and drinking  Follow the diet that is recommended by your health care provider.  If you vomit, drink water, juice, or soup when you can drink without  vomiting.  Make sure you have little or no nausea before eating solid foods. General instructions  Have a responsible adult stay with you until you are awake and alert.  Take over-the-counter and prescription medicines only as told by your health care provider.  If you smoke, do not smoke without supervision.  Keep all follow-up visits as told by your health care provider. This is important. Contact a health care provider if:  You keep feeling nauseous or you keep vomiting.  You feel light-headed.  You develop a rash.  You have a fever. Get help right away if:  You have trouble breathing. This information is not intended to replace advice given to you by your health care provider. Make sure you discuss any questions you have with your health care provider. Document Released: 04/23/2015 Document Revised: 08/23/2015 Document Reviewed: 04/23/2015 Elsevier Interactive Patient Education  [employer].

## 2017-05-28 ENCOUNTER — Encounter (HOSPITAL_COMMUNITY): Payer: Self-pay

## 2017-05-28 ENCOUNTER — Other Ambulatory Visit: Payer: Self-pay

## 2017-05-28 ENCOUNTER — Encounter (HOSPITAL_COMMUNITY)
Admission: RE | Admit: 2017-05-28 | Discharge: 2017-05-28 | Disposition: A | Discharge status: Home / Self Care | Payer: BC Managed Care – PPO | Source: Ambulatory Visit | Attending: Gastroenterology | Admitting: Gastroenterology

## 2017-05-28 DIAGNOSIS — Z01812 Encounter for preprocedural laboratory examination: Secondary | ICD-10-CM | POA: Diagnosis present

## 2017-05-28 HISTORY — DX: Anxiety disorder, unspecified: F41.9

## 2017-05-28 LAB — CBC
HCT: 44 % (ref 36.0–46.0)
Hemoglobin: 14.5 g/dL (ref 12.0–15.0)
MCH: 32 pg (ref 26.0–34.0)
MCHC: 33 g/dL (ref 30.0–36.0)
MCV: 97.1 fL (ref 78.0–100.0)
PLATELETS: 232 10*3/uL (ref 150–400)
RBC: 4.53 MIL/uL (ref 3.87–5.11)
RDW: 13.8 % (ref 11.5–15.5)
WBC: 7.9 10*3/uL (ref 4.0–10.5)

## 2017-05-28 LAB — BASIC METABOLIC PANEL
Anion gap: 8 (ref 5–15)
BUN: 10 mg/dL (ref 6–20)
CALCIUM: 8.9 mg/dL (ref 8.9–10.3)
CO2: 29 mmol/L (ref 22–32)
CREATININE: 0.75 mg/dL (ref 0.44–1.00)
Chloride: 100 mmol/L — ABNORMAL LOW (ref 101–111)
GFR calc Af Amer: >60 mL/min (ref >60)
Glucose, Bld: 114 mg/dL — ABNORMAL HIGH (ref 65–99)
Potassium: 3.3 mmol/L — ABNORMAL LOW (ref 3.5–5.1)
SODIUM: 137 mmol/L (ref 135–145)

## 2017-05-29 ENCOUNTER — Telehealth: Payer: Self-pay | Admitting: Nurse Practitioner

## 2017-05-29 DIAGNOSIS — E876 Hypokalemia: Secondary | ICD-10-CM

## 2017-05-29 MED ORDER — POTASSIUM CHLORIDE ER 10 MEQ PO TBCR: 20.0000 meq | EXTENDED_RELEASE_TABLET | Freq: Two times a day (BID) | ORAL | 0 refills | Status: DC

## 2017-05-29 NOTE — Progress Notes (Signed)
Randall Hiss, please advise in Dr. Oneida Alar absence.

## 2017-05-29 NOTE — Progress Notes (Signed)
See separate note. Potassium was sent in.

## 2017-05-29 NOTE — Telephone Encounter (Signed)
-----   Message from Everardo All, LPN sent at [phone]  3:05 PM EDT ----- Randall Hiss, please advise in Dr. Oneida Alar absence.

## 2017-05-29 NOTE — Telephone Encounter (Signed)
Left the message on her Vm to pick up meds to take for potassium.

## 2017-05-29 NOTE — Telephone Encounter (Signed)
Creatinine normal. K+ 3.3. Will send in KCl 20 mEq bid x 3 days.  Please notify patient.

## 2017-06-03 ENCOUNTER — Other Ambulatory Visit: Payer: Self-pay

## 2017-06-03 ENCOUNTER — Encounter (HOSPITAL_COMMUNITY)
Admission: RE | Disposition: A | Discharge status: Home / Self Care | Payer: Self-pay | Source: Ambulatory Visit | Attending: Gastroenterology

## 2017-06-03 ENCOUNTER — Ambulatory Visit (HOSPITAL_COMMUNITY)
Admission: RE | Admit: 2017-06-03 | Discharge: 2017-06-03 | Disposition: A | Discharge status: Home / Self Care | Payer: BC Managed Care – PPO | Source: Ambulatory Visit | Attending: Gastroenterology | Admitting: Gastroenterology

## 2017-06-03 ENCOUNTER — Ambulatory Visit (HOSPITAL_COMMUNITY): Payer: BC Managed Care – PPO | Admitting: Anesthesiology

## 2017-06-03 ENCOUNTER — Encounter (HOSPITAL_COMMUNITY): Payer: Self-pay | Admitting: Emergency Medicine

## 2017-06-03 DIAGNOSIS — Q439 Congenital malformation of intestine, unspecified: Secondary | ICD-10-CM | POA: Insufficient documentation

## 2017-06-03 DIAGNOSIS — Z1211 Encounter for screening for malignant neoplasm of colon: Secondary | ICD-10-CM | POA: Diagnosis not present

## 2017-06-03 DIAGNOSIS — K5730 Diverticulosis of large intestine without perforation or abscess without bleeding: Secondary | ICD-10-CM | POA: Insufficient documentation

## 2017-06-03 DIAGNOSIS — K648 Other hemorrhoids: Secondary | ICD-10-CM | POA: Insufficient documentation

## 2017-06-03 DIAGNOSIS — F419 Anxiety disorder, unspecified: Secondary | ICD-10-CM | POA: Insufficient documentation

## 2017-06-03 DIAGNOSIS — K644 Residual hemorrhoidal skin tags: Secondary | ICD-10-CM | POA: Diagnosis not present

## 2017-06-03 DIAGNOSIS — F17210 Nicotine dependence, cigarettes, uncomplicated: Secondary | ICD-10-CM | POA: Diagnosis not present

## 2017-06-03 DIAGNOSIS — D128 Benign neoplasm of rectum: Secondary | ICD-10-CM | POA: Insufficient documentation

## 2017-06-03 DIAGNOSIS — G4700 Insomnia, unspecified: Secondary | ICD-10-CM | POA: Diagnosis not present

## 2017-06-03 DIAGNOSIS — K621 Rectal polyp: Secondary | ICD-10-CM | POA: Insufficient documentation

## 2017-06-03 DIAGNOSIS — D123 Benign neoplasm of transverse colon: Secondary | ICD-10-CM | POA: Insufficient documentation

## 2017-06-03 DIAGNOSIS — D124 Benign neoplasm of descending colon: Secondary | ICD-10-CM

## 2017-06-03 HISTORY — PX: POLYPECTOMY: SHX5525

## 2017-06-03 HISTORY — PX: COLONOSCOPY WITH PROPOFOL: SHX5780

## 2017-06-03 SURGERY — COLONOSCOPY WITH PROPOFOL
Anesthesia: Monitor Anesthesia Care

## 2017-06-03 MED ORDER — LACTATED RINGERS IV SOLN: INTRAVENOUS | Status: DC

## 2017-06-03 MED ORDER — CHLORHEXIDINE GLUCONATE CLOTH 2 % EX PADS: 6.0000 | MEDICATED_PAD | Freq: Once | CUTANEOUS | Status: DC

## 2017-06-03 MED ORDER — HYDROCODONE-ACETAMINOPHEN 7.5-325 MG PO TABS: 1.0000 | ORAL_TABLET | Freq: Once | ORAL | Status: DC | PRN

## 2017-06-03 MED ORDER — PROPOFOL 10 MG/ML IV BOLUS
INTRAVENOUS | Status: DC | PRN
  Administered 2017-06-03: 40 mg via INTRAVENOUS
  Administered 2017-06-03 (×3): 20 mg via INTRAVENOUS

## 2017-06-03 MED ORDER — HYDROMORPHONE HCL 1 MG/ML IJ SOLN: 0.2500 mg | INTRAMUSCULAR | Status: DC | PRN

## 2017-06-03 MED ORDER — LACTATED RINGERS IV SOLN
INTRAVENOUS | Status: DC
  Administered 2017-06-03: 08:00:00 via INTRAVENOUS

## 2017-06-03 MED ORDER — MEPERIDINE HCL 100 MG/ML IJ SOLN: 6.2500 mg | INTRAMUSCULAR | Status: DC | PRN

## 2017-06-03 MED ORDER — PROPOFOL 500 MG/50ML IV EMUL
INTRAVENOUS | Status: DC | PRN
  Administered 2017-06-03: 125 ug/kg/min via INTRAVENOUS
  Administered 2017-06-03: 150 ug/kg/min via INTRAVENOUS

## 2017-06-03 MED ORDER — PROMETHAZINE HCL 25 MG/ML IJ SOLN: 6.2500 mg | INTRAMUSCULAR | Status: DC | PRN

## 2017-06-03 NOTE — Anesthesia Procedure Notes (Signed)
Procedure Name: MAC Date/Time: 06/03/2017 9:34 AM Performed by: Vista Deck, CRNA Pre-anesthesia Checklist: Patient identified, Emergency Drugs available, Suction available, Timeout performed and Patient being monitored Patient Re-evaluated:Patient Re-evaluated prior to induction Oxygen Delivery Method: Non-rebreather mask

## 2017-06-03 NOTE — Anesthesia Postprocedure Evaluation (Signed)
Anesthesia Post Note  Patient: Sarah Juarez  Procedure(s) Performed: COLONOSCOPY WITH PROPOFOL (N/A ) POLYPECTOMY  Patient location during evaluation: PACU Anesthesia Type: MAC Level of consciousness: awake and alert and patient cooperative Pain management: satisfactory to patient Vital Signs Assessment: post-procedure vital signs reviewed and stable Respiratory status: spontaneous breathing Cardiovascular status: stable Postop Assessment: no apparent nausea or vomiting Anesthetic complications: no     Last Vitals:  Vitals:   06/03/17 0915 06/03/17 1015  BP: (!) 113/57 104/65  Pulse:  70  Resp:  17  Temp:  36.6 C  SpO2: 99% 99%    Last Pain:  Vitals:   06/03/17 1015  TempSrc:   PainSc: 0-No pain                 Chanique Duca

## 2017-06-03 NOTE — Discharge Instructions (Signed)
You have MODERATE SIZE internal AND EXTERNAL hemorrhoids and diverticulosis IN YOUR LEFT COLON. YOU HAD FIVE POLYPS REMOVED.    DRINK WATER TO KEEP YOUR URINE LIGHT YELLOW.  CONTINUE YOUR WEIGHT LOSS EFFORTS. YOUR BODY MASS INDEX IS OVER 30 WHICH MEANS YOU ARE OBESE. OBESITY IS ASSOCIATED WITH AN INCREASED FOR CIRRHOSIS AND ALL CANCERS, INCLUDING ESOPHAGEAL AND COLON CANCER. A WEIGHT OF 200 LBS OR LESS   WILL GET YOUR BODY MASS INDEX(BMI) UNDER 30.  FOLLOW A HIGH FIBER DIET. AVOID ITEMS THAT CAUSE BLOATING. See info below.   YOUR BIOPSY RESULTS WILL BE AVAILABLE IN 7 DAYS.   USE PREPARATION H FOUR TIMES  A DAY IF NEEDED TO RELIEVE RECTAL PAIN/PRESSURE/BLEEDING.   Next colonoscopy in 3 years.  Colonoscopy Care After Read the instructions outlined below and refer to this sheet in the next week. These discharge instructions provide you with general information on caring for yourself after you leave the hospital. While your treatment has been planned according to the most current medical practices available, unavoidable complications occasionally occur. If you have any problems or questions after discharge, call DR. Jeilyn Reznik, [phone].  ACTIVITY  You may resume your regular activity, but move at a slower pace for the next 24 hours.   Take frequent rest periods for the next 24 hours.   Walking will help get rid of the air and reduce the bloated feeling in your belly (abdomen).   No driving for 24 hours (because of the medicine (anesthesia) used during the test).   You may shower.   Do not sign any important legal documents or operate any machinery for 24 hours (because of the anesthesia used during the test).    NUTRITION  Drink plenty of fluids.   You may resume your normal diet as instructed by your doctor.   Begin with a light meal and progress to your normal diet. Heavy or fried foods are harder to digest and may make you feel sick to your stomach (nauseated).   Avoid  alcoholic beverages for 24 hours or as instructed.    MEDICATIONS  You may resume your normal medications.   WHAT YOU CAN EXPECT TODAY  Some feelings of bloating in the abdomen.   Passage of more gas than usual.   Spotting of blood in your stool or on the toilet paper  .  IF YOU HAD POLYPS REMOVED DURING THE COLONOSCOPY:  Eat a soft diet IF YOU HAVE NAUSEA, BLOATING, ABDOMINAL PAIN, OR VOMITING.    FINDING OUT THE RESULTS OF YOUR TEST Not all test results are available during your visit. DR. Oneida Alar WILL CALL YOU WITHIN 14 DAYS OF YOUR PROCEDUE WITH YOUR RESULTS. Do not assume everything is normal if you have not heard from DR. Abryana Lykens, CALL HER OFFICE AT [phone].  SEEK IMMEDIATE MEDICAL ATTENTION AND CALL THE OFFICE: [phone] IF:  You have more than a spotting of blood in your stool.   Your belly is swollen (abdominal distention).   You are nauseated or vomiting.   You have a temperature over 101F.   You have abdominal pain or discomfort that is severe or gets worse throughout the day.  High-Fiber Diet A high-fiber diet changes your normal diet to include more whole grains, legumes, fruits, and vegetables. Changes in the diet involve replacing refined carbohydrates with unrefined foods. The calorie level of the diet is essentially unchanged. The Dietary Reference Intake (recommended amount) for adult males is 38 grams per day. For adult females, it is  25 grams per day. Pregnant and lactating women should consume 28 grams of fiber per day. Fiber is the intact part of a plant that is not broken down during digestion. Functional fiber is fiber that has been isolated from the plant to provide a beneficial effect in the body. PURPOSE  Increase stool bulk.   Ease and regulate bowel movements.   Lower cholesterol.   REDUCE RISK OF COLON CANCER  INDICATIONS THAT YOU NEED MORE FIBER  Constipation and hemorrhoids.   Uncomplicated diverticulosis (intestine  condition) and irritable bowel syndrome.   Weight management.   As a protective measure against hardening of the arteries (atherosclerosis), diabetes, and cancer.   GUIDELINES FOR INCREASING FIBER IN THE DIET  Start adding fiber to the diet slowly. A gradual increase of about 5 more grams (2 slices of whole-wheat bread, 2 servings of most fruits or vegetables, or 1 bowl of high-fiber cereal) per day is best. Too rapid an increase in fiber may result in constipation, flatulence, and bloating.   Drink enough water and fluids to keep your urine clear or pale yellow. Water, juice, or caffeine-free drinks are recommended. Not drinking enough fluid may cause constipation.   Eat a variety of high-fiber foods rather than one type of fiber.   Try to increase your intake of fiber through using high-fiber foods rather than fiber pills or supplements that contain small amounts of fiber.   The goal is to change the types of food eaten. Do not supplement your present diet with high-fiber foods, but replace foods in your present diet.   INCLUDE A VARIETY OF FIBER SOURCES  Replace refined and processed grains with whole grains, canned fruits with fresh fruits, and incorporate other fiber sources. White rice, white breads, and most bakery goods contain little or no fiber.   Brown whole-grain rice, buckwheat oats, and many fruits and vegetables are all good sources of fiber. These include: broccoli, Brussels sprouts, cabbage, cauliflower, beets, sweet potatoes, white potatoes (skin on), carrots, tomatoes, eggplant, squash, berries, fresh fruits, and dried fruits.   Cereals appear to be the richest source of fiber. Cereal fiber is found in whole grains and bran. Bran is the fiber-rich outer coat of cereal grain, which is largely removed in refining. In whole-grain cereals, the bran remains. In breakfast cereals, the largest amount of fiber is found in those with "bran" in their names. The fiber content is  sometimes indicated on the label.   You may need to include additional fruits and vegetables each day.   In baking, for 1 cup white flour, you may use the following substitutions:   1 cup whole-wheat flour minus 2 tablespoons.   1/2 cup white flour plus 1/2 cup whole-wheat flour.   Polyps, Colon  A polyp is extra tissue that grows inside your body. Colon polyps grow in the large intestine. The large intestine, also called the colon, is part of your digestive system. It is a long, hollow tube at the end of your digestive tract where your body makes and stores stool. Most polyps are not dangerous. They are benign. This means they are not cancerous. But over time, some types of polyps can turn into cancer. Polyps that are smaller than a pea are usually not harmful. But larger polyps could someday become or may already be cancerous. To be safe, doctors remove all polyps and test them.   PREVENTION There is not one sure way to prevent polyps. You might be able to lower your risk  of getting them if you:  Eat more fruits and vegetables and less fatty food.   Do not smoke.   Avoid alcohol.   Exercise every day.   Lose weight if you are overweight.   Eating more calcium and folate can also lower your risk of getting polyps. Some foods that are rich in calcium are milk, cheese, and broccoli. Some foods that are rich in folate are chickpeas, kidney beans, and spinach.    Diverticulosis Diverticulosis is a common condition that develops when small pouches (diverticula) form in the wall of the colon. The risk of diverticulosis increases with age. It happens more often in people who eat a low-fiber diet. Most individuals with diverticulosis have no symptoms. Those individuals with symptoms usually experience belly (abdominal) pain, constipation, or loose stools (diarrhea).  HOME CARE INSTRUCTIONS  Increase the amount of fiber in your diet as directed by your caregiver or dietician. This may  reduce symptoms of diverticulosis.   Drink at least 6 to 8 glasses of water each day to prevent constipation.   Try not to strain when you have a bowel movement.   Avoiding nuts and seeds to prevent complications is NOT NECESSARY.   FOODS HAVING HIGH FIBER CONTENT INCLUDE:  Fruits. Apple, peach, pear, tangerine, raisins, prunes.   Vegetables. Brussels sprouts, asparagus, broccoli, cabbage, carrot, cauliflower, romaine lettuce, spinach, summer squash, tomato, winter squash, zucchini.   Starchy Vegetables. Baked beans, kidney beans, lima beans, split peas, lentils, potatoes (with skin).   Grains. Whole wheat bread, brown rice, bran flake cereal, plain oatmeal, white rice, shredded wheat, bran muffins.   SEEK IMMEDIATE MEDICAL CARE IF:  You develop increasing pain or severe bloating.   You have an oral temperature above 101F.   You develop vomiting or bowel movements that are bloody or black.   Hemorrhoids Hemorrhoids are dilated (enlarged) veins around the rectum. Sometimes clots will form in the veins. This makes them swollen and painful. These are called thrombosed hemorrhoids. Causes of hemorrhoids include:  Constipation.   Straining to have a bowel movement.   HEAVY LIFTING   HOME CARE INSTRUCTIONS  Eat a well balanced diet and drink 6 to 8 glasses of water every day to avoid constipation. You may also use a bulk laxative.   Avoid straining to have bowel movements.   Keep anal area dry and clean.   Do not use a donut shaped pillow or sit on the toilet for long periods. This increases blood pooling and pain.   Move your bowels when your body has the urge; this will require less straining and will decrease pain and pressure.

## 2017-06-03 NOTE — Transfer of Care (Signed)
Immediate Anesthesia Transfer of Care Note  Patient: Sarah Juarez  Procedure(s) Performed: COLONOSCOPY WITH PROPOFOL (N/A ) POLYPECTOMY  Patient Location: PACU  Anesthesia Type:MAC  Level of Consciousness: awake, alert  and patient cooperative  Airway & Oxygen Therapy: Patient Spontanous Breathing  Post-op Assessment: Report given to RN and Post -op Vital signs reviewed and stable  Post vital signs: Reviewed and stable  Last Vitals:  Vitals Value Taken Time  BP    Temp    Pulse 57 06/03/2017 10:14 AM  Resp    SpO2 83 % 06/03/2017 10:14 AM  Vitals shown include unvalidated device data.  Last Pain:  Vitals:   06/03/17 0943  TempSrc:   PainSc: 0-No pain         Complications: No apparent anesthesia complications

## 2017-06-03 NOTE — Anesthesia Preprocedure Evaluation (Signed)
Anesthesia Evaluation    Reviewed: Allergy & Precautions, H&P , Patient's Chart, lab work & pertinent test results  Airway Mallampati: III  TM Distance: >3 FB Neck ROM: full    Dental  (+) Partial Lower, Dental Advidsory Given   Pulmonary neg pulmonary ROS, Current Smoker,    breath sounds clear to auscultation       Cardiovascular Exercise Tolerance: Good negative cardio ROS   Rhythm:regular     Neuro/Psych Anxiety negative neurological ROS  negative psych ROS   GI/Hepatic negative GI ROS, Neg liver ROS,   Endo/Other  negative endocrine ROS  Renal/GU negative Renal ROS  negative genitourinary   Musculoskeletal   Abdominal   Peds  Hematology negative hematology ROS (+)   Anesthesia Other Findings S/p ACD/F with full range of motion  Reproductive/Obstetrics negative OB ROS                             Anesthesia Physical Anesthesia Plan  ASA: II  Anesthesia Plan: MAC   Post-op Pain Management:    Induction:   PONV Risk Score and Plan:   Airway Management Planned:   Additional Equipment:   Intra-op Plan:   Post-operative Plan:   Informed Consent: I have reviewed the patients History and Physical, chart, labs and discussed the procedure including the risks, benefits and alternatives for the proposed anesthesia with the patient or authorized representative who has indicated his/her understanding and acceptance.   Dental Advisory Given  Plan Discussed with: Anesthesiologist  Anesthesia Plan Comments:         Anesthesia Quick Evaluation

## 2017-06-03 NOTE — H&P (Signed)
Primary Care Physician:  Redmond School, MD Primary Gastroenterologist:  Dr. Oneida Alar  Pre-Procedure History & Physical: HPI:  Sarah Juarez is a 57 y.o. female here for [facility].  Past Medical History:  Diagnosis Date  . Anxiety   . Insomnia     Past Surgical History:  Procedure Laterality Date  . CERVICAL SPINE SURGERY  04/2016    Prior to Admission medications   Medication Sig Start Date End Date Taking? Authorizing Provider  diazepam (VALIUM) 2 MG tablet Take 2 mg by mouth every 6 (six) hours as needed for anxiety.   Yes [provider]  naproxen sodium (ALEVE) 220 MG tablet Take 440 mg by mouth 2 (two) times daily as needed (for pain/headaches.).   Yes [provider]  polyethylene glycol-electrolytes (TRILYTE) 420 g solution Take 4,000 mLs by mouth as directed. 04/07/17  Yes Avannah Decker L, MD  zolpidem (AMBIEN) 10 MG tablet Take 10 mg by mouth at bedtime as needed (for sleep.).    Yes [provider]  potassium chloride (K-DUR) 10 MEQ tablet Take 2 tablets (20 mEq total) by mouth 2 (two) times daily for 3 days. 05/29/17 06/01/17  Carlis Stable, NP    Allergies as of 04/07/2017  . (No Known Allergies)    Family History  Problem Relation Age of Onset  . Colon cancer Neg Hx   . Gastric cancer Neg Hx   . Esophageal cancer Neg Hx     Social History   Socioeconomic History  . Marital status: Single    Spouse name: Not on file  . Number of children: Not on file  . Years of education: Not on file  . Highest education level: Not on file  Occupational History  . Not on file  Social Needs  . Financial resource strain: Not on file  . Food insecurity:    Worry: Not on file    Inability: Not on file  . Transportation needs:    Medical: Not on file    Non-medical: Not on file  Tobacco Use  . Smoking status: Current Every Day Smoker    Packs/day: 0.50    Types: Cigarettes  . Smokeless tobacco: Never Used  . Tobacco comment:  less than a pack daily  Substance and Sexual Activity  . Alcohol use: No  . Drug use: No  . Sexual activity: Not on file  Lifestyle  . Physical activity:    Days per week: Not on file    Minutes per session: Not on file  . Stress: Not on file  Relationships  . Social connections:    Talks on phone: Not on file    Gets together: Not on file    Attends religious service: Not on file    Active member of club or organization: Not on file    Attends meetings of clubs or organizations: Not on file    Relationship status: Not on file  . Intimate partner violence:    Fear of current or ex partner: Not on file    Emotionally abused: Not on file    Physically abused: Not on file    Forced sexual activity: Not on file  Other Topics Concern  . Not on file  Social History Narrative  . Not on file    Review of Systems: See HPI, otherwise negative ROS   Physical Exam: BP 124/77   Pulse 84   Temp 98.5 F (36.9 C) (Oral)   Resp 17  SpO2 97%  General:   Alert,  pleasant and cooperative in NAD Head:  Normocephalic and atraumatic. Neck:  Supple; Lungs:  Clear throughout to auscultation.    Heart:  Regular rate and rhythm. Abdomen:  Soft, nontender and nondistended. Normal bowel sounds, without guarding, and without rebound.   Neurologic:  Alert and  oriented x4;  grossly normal neurologically.  Impression/Plan:     SCREENING  Plan:  1. TCS TODAY. DISCUSSED PROCEDURE, BENEFITS, & RISKS: < 1% chance of medication reaction, bleeding, perforation, or rupture of spleen/liver.

## 2017-06-03 NOTE — Op Note (Signed)
[facility] Patient Name: Sarah Juarez Procedure Date: 06/03/2017 8:05 AM MRN: [id number] Date of Birth: 06-20-1960 Attending MD: Barney Drain MD, MD CSN: [id number] Age: 57 Admit Type: Outpatient Procedure:                Colonoscopy WITH COLD SNARE/SNARE CAUTERY                            POLYPECTOMY Indications:              Screening for colorectal malignant neoplasm Providers:                Barney Drain MD, MD, Otis Peak B. Tashonna Seller, RN, Nelma Rothman, Technician Referring MD:             Redmond School, MD Medicines:                Propofol per Anesthesia Complications:            No immediate complications. Estimated Blood Loss:     Estimated blood loss was minimal. Procedure:                Pre-Anesthesia Assessment:                           - Prior to the procedure, a History and Physical                            was performed, and patient medications and                            allergies were reviewed. The patient's tolerance of                            previous anesthesia was also reviewed. The risks                            and benefits of the procedure and the sedation                            options and risks were discussed with the patient.                            All questions were answered, and informed consent                            was obtained. Prior Anticoagulants: The patient has                            taken naproxen, last dose was 7 days prior to                            procedure. ASA Grade Assessment: II - A patient  with mild systemic disease. After reviewing the                            risks and benefits, the patient was deemed in                            satisfactory condition to undergo the procedure.                            After obtaining informed consent, the colonoscope                            was passed under direct vision. Throughout the       procedure, the patient's blood pressure, pulse, and                            oxygen saturations were monitored continuously. The                            EC-[id number] ([id number]) scope was introduced through                            the anus and advanced to the the cecum, identified                            by appendiceal orifice and ileocecal valve. The                            colonoscopy was somewhat difficult due to a                            tortuous colon. Successful completion of the                            procedure was aided by straightening and shortening                            the scope to obtain bowel loop reduction and                            COLOWRAP. The patient tolerated the procedure well.                            The quality of the bowel preparation was good. The                            ileocecal valve, appendiceal orifice, and rectum                            were photographed. Scope In: 9:44:53 AM Scope Out: 10:07:28 AM Scope Withdrawal Time: 0 hours 20 minutes 21 seconds  Total Procedure Duration: 0 hours 22 minutes 35 seconds  Findings:      Two sessile polyps were found in the  mid descending colon and mid       transverse colon. The polyps were 5 to 6 mm in size. These polyps were       removed with a hot snare. Resection and retrieval were complete.      Three sessile polyps were found in the rectum and proximal transverse       colon. The polyps were 3 to 4 mm in size. These polyps were removed with       a cold snare. Resection and retrieval were complete.      Multiple small and large-mouthed diverticula were found in the       recto-sigmoid colon, sigmoid colon and descending colon.      External and internal hemorrhoids were found. The hemorrhoids were       moderate. Impression:               - Two 5 to 6 mm polyps in the mid descending colon                            and in the mid transverse colon, removed with a hot                             snare. Resected and retrieved.                           - THREE 3 to 4 mm polyps in the rectum(2) and in                            the proximal transverse colon, removed with a cold                            snare. Resected and retrieved.                           - Diverticulosis in the recto-sigmoid colon, in the                            sigmoid colon and in the descending colon.                           - External and internal hemorrhoids. Moderate Sedation:      Per Anesthesia Care Recommendation:           - Repeat colonoscopy in 3 years for surveillance.                           - High fiber diet. LOSE WIEGHT TO BMI < 30.                           - Continue present medications.                           - Patient has a contact number available for                            emergencies. The signs and symptoms  of potential                            delayed complications were discussed with the                            patient. Return to normal activities tomorrow.                            Written discharge instructions were provided to the                            patient. Procedure Code(s):        --- Professional ---                           [id number], Colonoscopy, flexible; with removal of                            tumor(s), polyp(s), or other lesion(s) by snare                            technique Diagnosis Code(s):        --- Professional ---                           Z12.11, Encounter for screening for malignant                            neoplasm of colon                           D12.4, Benign neoplasm of descending colon                           K62.1, Rectal polyp                           D12.3, Benign neoplasm of transverse colon (hepatic                            flexure or splenic flexure)                           K64.8, Other hemorrhoids                           K57.30, Diverticulosis of large intestine without                             perforation or abscess without bleeding CPT copyright 2017 [employer]. All rights reserved. The codes documented in this report are preliminary and upon coder review may  be revised to meet current compliance requirements. Barney Drain, MD Barney Drain MD, MD 06/03/2017 10:16:05 AM This report has been signed electronically. Number of Addenda: 0

## 2017-06-05 ENCOUNTER — Encounter (HOSPITAL_COMMUNITY): Payer: Self-pay | Admitting: Gastroenterology

## 2017-06-06 ENCOUNTER — Telehealth: Payer: Self-pay | Admitting: Gastroenterology

## 2017-06-06 NOTE — Telephone Encounter (Signed)
LMOM for a return call.  

## 2017-06-06 NOTE — Telephone Encounter (Signed)
Please call pt. She had FOUR simple adenomas  AN DONE HYPERPLASTIC POLYP removed from her colon.   DRINK WATER TO KEEP YOUR URINE LIGHT YELLOW.  CONTINUE YOUR WEIGHT LOSS EFFORTS.  A WEIGHT OF 200 LBS OR LESS  WILL GET YOUR BODY MASS INDEX(BMI) UNDER 30.  FOLLOW A HIGH FIBER DIET. AVOID ITEMS THAT CAUSE BLOATING.   USE PREPARATION H FOUR TIMES  A DAY IF NEEDED TO RELIEVE RECTAL PAIN/PRESSURE/BLEEDING.  Next colonoscopy in 3 years.

## 2017-06-10 NOTE — Telephone Encounter (Signed)
Letter mailed for pt to call for results.  

## 2017-06-10 NOTE — Telephone Encounter (Signed)
LMOM to call.

## 2017-06-11 NOTE — Telephone Encounter (Signed)
Pt is aware of results. 

## 2017-06-17 ENCOUNTER — Ambulatory Visit
Admission: RE | Admit: 2017-06-17 | Discharge: 2017-06-17 | Disposition: A | Discharge status: Home / Self Care | Payer: BC Managed Care – PPO | Source: Ambulatory Visit | Attending: Family Medicine | Admitting: Family Medicine

## 2017-06-17 DIAGNOSIS — Z1231 Encounter for screening mammogram for malignant neoplasm of breast: Secondary | ICD-10-CM

## 2017-06-18 ENCOUNTER — Other Ambulatory Visit: Payer: Self-pay | Admitting: Family Medicine

## 2017-06-18 DIAGNOSIS — R928 Other abnormal and inconclusive findings on diagnostic imaging of breast: Secondary | ICD-10-CM

## 2017-06-23 ENCOUNTER — Ambulatory Visit
Admission: RE | Admit: 2017-06-23 | Discharge: 2017-06-23 | Disposition: A | Discharge status: Home / Self Care | Payer: BC Managed Care – PPO | Source: Ambulatory Visit | Attending: Family Medicine | Admitting: Family Medicine

## 2017-06-23 ENCOUNTER — Other Ambulatory Visit: Payer: Self-pay | Admitting: Family Medicine

## 2017-06-23 ENCOUNTER — Ambulatory Visit: Payer: BC Managed Care – PPO

## 2017-06-23 DIAGNOSIS — R928 Other abnormal and inconclusive findings on diagnostic imaging of breast: Secondary | ICD-10-CM

## 2017-06-24 ENCOUNTER — Other Ambulatory Visit: Payer: Self-pay | Admitting: Family Medicine

## 2017-06-24 DIAGNOSIS — N6489 Other specified disorders of breast: Secondary | ICD-10-CM

## 2017-12-25 ENCOUNTER — Other Ambulatory Visit: Payer: Self-pay | Admitting: Family Medicine

## 2017-12-25 ENCOUNTER — Ambulatory Visit
Admission: RE | Admit: 2017-12-25 | Discharge: 2017-12-25 | Disposition: A | Discharge status: Home / Self Care | Payer: BC Managed Care – PPO | Source: Ambulatory Visit | Attending: Family Medicine | Admitting: Family Medicine

## 2017-12-25 ENCOUNTER — Ambulatory Visit: Payer: BC Managed Care – PPO

## 2017-12-25 DIAGNOSIS — R928 Other abnormal and inconclusive findings on diagnostic imaging of breast: Secondary | ICD-10-CM

## 2017-12-25 DIAGNOSIS — R921 Mammographic calcification found on diagnostic imaging of breast: Secondary | ICD-10-CM

## 2018-06-22 ENCOUNTER — Ambulatory Visit
Admission: RE | Admit: 2018-06-22 | Discharge: 2018-06-22 | Disposition: A | Discharge status: Home / Self Care | Payer: BC Managed Care – PPO | Source: Ambulatory Visit | Attending: Family Medicine | Admitting: Family Medicine

## 2018-06-22 ENCOUNTER — Other Ambulatory Visit: Payer: Self-pay

## 2018-06-22 DIAGNOSIS — R921 Mammographic calcification found on diagnostic imaging of breast: Secondary | ICD-10-CM

## 2018-07-22 ENCOUNTER — Other Ambulatory Visit: Payer: Self-pay | Admitting: Surgery

## 2018-07-22 DIAGNOSIS — N6021 Fibroadenosis of right breast: Secondary | ICD-10-CM

## 2018-08-18 ENCOUNTER — Ambulatory Visit
Admission: RE | Admit: 2018-08-18 | Discharge: 2018-08-18 | Disposition: A | Discharge status: Home / Self Care | Payer: BC Managed Care – PPO | Source: Ambulatory Visit | Attending: Surgery | Admitting: Surgery

## 2018-08-18 ENCOUNTER — Other Ambulatory Visit: Payer: Self-pay

## 2018-08-18 DIAGNOSIS — N6021 Fibroadenosis of right breast: Secondary | ICD-10-CM

## 2018-08-18 MED ORDER — GADOBUTROL 1 MMOL/ML IV SOLN
10.0000 mL | Freq: Once | INTRAVENOUS | Status: AC | PRN
  Administered 2018-08-18: 10 mL via INTRAVENOUS

## 2018-08-25 ENCOUNTER — Other Ambulatory Visit: Payer: Self-pay | Admitting: Surgery

## 2018-08-25 DIAGNOSIS — N6022 Fibroadenosis of left breast: Secondary | ICD-10-CM

## 2018-08-25 DIAGNOSIS — N6021 Fibroadenosis of right breast: Secondary | ICD-10-CM

## 2018-09-02 ENCOUNTER — Ambulatory Visit
Admission: RE | Admit: 2018-09-02 | Discharge: 2018-09-02 | Disposition: A | Discharge status: Home / Self Care | Payer: BC Managed Care – PPO | Source: Ambulatory Visit | Attending: Surgery | Admitting: Surgery

## 2018-09-02 ENCOUNTER — Other Ambulatory Visit: Payer: Self-pay

## 2018-09-02 DIAGNOSIS — N6021 Fibroadenosis of right breast: Secondary | ICD-10-CM

## 2018-09-02 DIAGNOSIS — N6022 Fibroadenosis of left breast: Secondary | ICD-10-CM

## 2018-09-03 ENCOUNTER — Other Ambulatory Visit: Payer: Self-pay | Admitting: Surgery

## 2018-09-03 DIAGNOSIS — R9389 Abnormal findings on diagnostic imaging of other specified body structures: Secondary | ICD-10-CM

## 2018-09-14 ENCOUNTER — Ambulatory Visit
Admission: RE | Admit: 2018-09-14 | Discharge: 2018-09-14 | Disposition: A | Discharge status: Home / Self Care | Payer: BC Managed Care – PPO | Source: Ambulatory Visit | Attending: Surgery | Admitting: Surgery

## 2018-09-14 ENCOUNTER — Other Ambulatory Visit: Payer: Self-pay

## 2018-09-14 DIAGNOSIS — R9389 Abnormal findings on diagnostic imaging of other specified body structures: Secondary | ICD-10-CM

## 2018-09-14 MED ORDER — GADOBUTROL 1 MMOL/ML IV SOLN
10.0000 mL | Freq: Once | INTRAVENOUS | Status: AC | PRN
  Administered 2018-09-14: 10 mL via INTRAVENOUS

## 2019-02-01 ENCOUNTER — Other Ambulatory Visit: Payer: Self-pay

## 2019-02-01 ENCOUNTER — Ambulatory Visit: Payer: BC Managed Care – PPO | Attending: Internal Medicine

## 2019-02-01 DIAGNOSIS — Z20822 Contact with and (suspected) exposure to covid-19: Secondary | ICD-10-CM

## 2019-02-02 LAB — NOVEL CORONAVIRUS, NAA: SARS-CoV-2, NAA: NOT DETECTED

## 2019-05-28 ENCOUNTER — Other Ambulatory Visit: Payer: Self-pay | Admitting: Internal Medicine

## 2019-05-28 DIAGNOSIS — Z1231 Encounter for screening mammogram for malignant neoplasm of breast: Secondary | ICD-10-CM

## 2019-06-25 ENCOUNTER — Other Ambulatory Visit: Payer: Self-pay

## 2019-06-25 ENCOUNTER — Ambulatory Visit
Admission: RE | Admit: 2019-06-25 | Discharge: 2019-06-25 | Disposition: A | Discharge status: Home / Self Care | Payer: BC Managed Care – PPO | Source: Ambulatory Visit | Attending: Internal Medicine | Admitting: Internal Medicine

## 2019-06-25 DIAGNOSIS — Z1231 Encounter for screening mammogram for malignant neoplasm of breast: Secondary | ICD-10-CM

## 2019-06-30 ENCOUNTER — Other Ambulatory Visit: Payer: Self-pay | Admitting: Internal Medicine

## 2019-06-30 DIAGNOSIS — R928 Other abnormal and inconclusive findings on diagnostic imaging of breast: Secondary | ICD-10-CM

## 2020-04-04 ENCOUNTER — Encounter: Payer: Self-pay | Admitting: Internal Medicine

## 2020-07-09 IMAGING — MG DIGITAL SCREENING BILAT W/ TOMO W/ CAD
8 series · 8 of 24 positions shown · non-contrast
Comparison: Prior films

CLINICAL DATA: Screening.

EXAM:
DIGITAL SCREENING BILATERAL MAMMOGRAM WITH TOMO AND CAD

[R CC synth-2D]
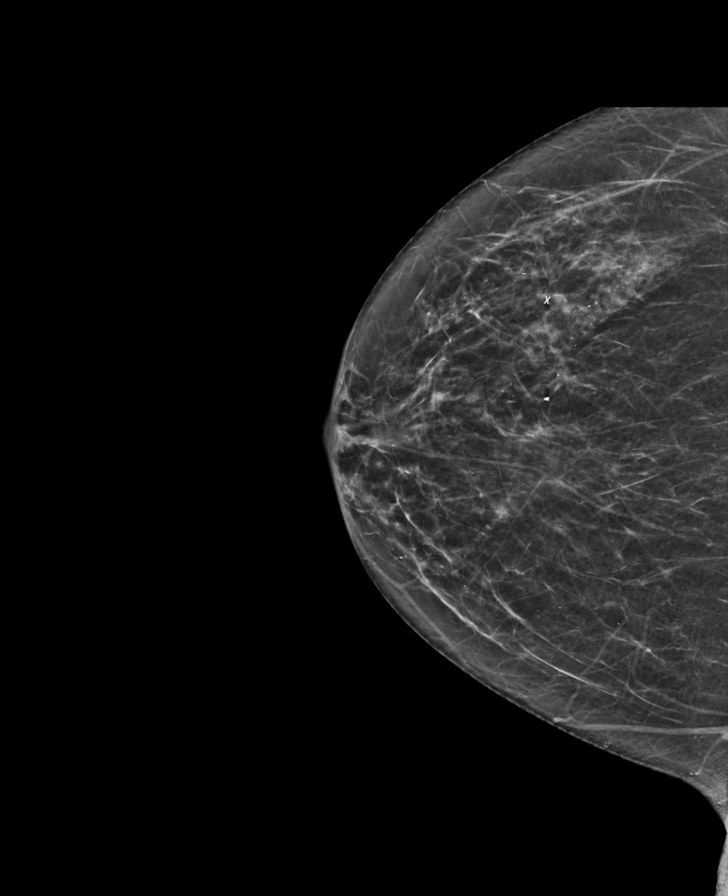

[L MLO synth-2D]
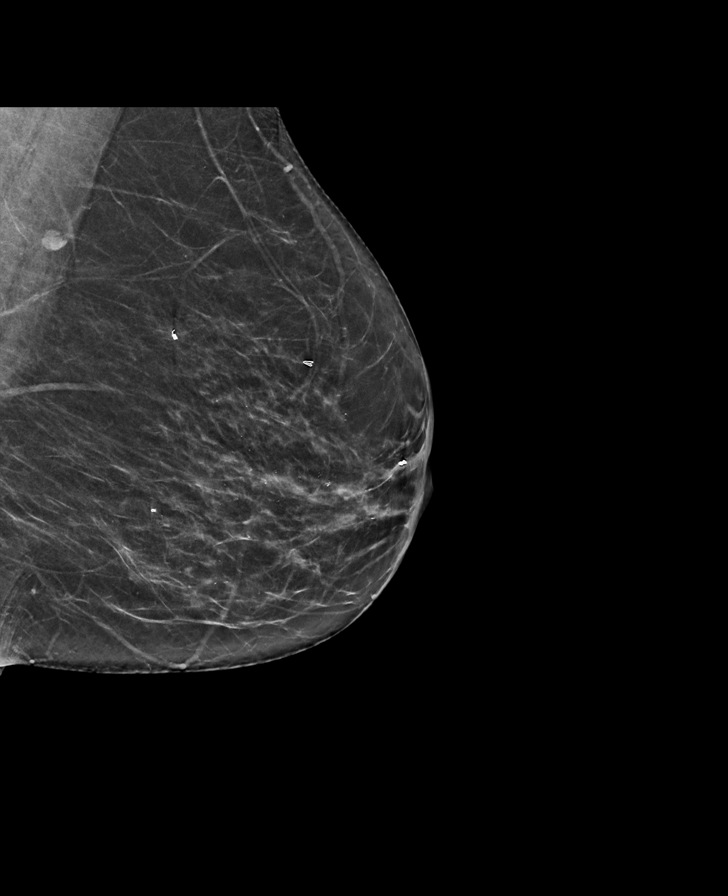

[R MLO synth-2D]
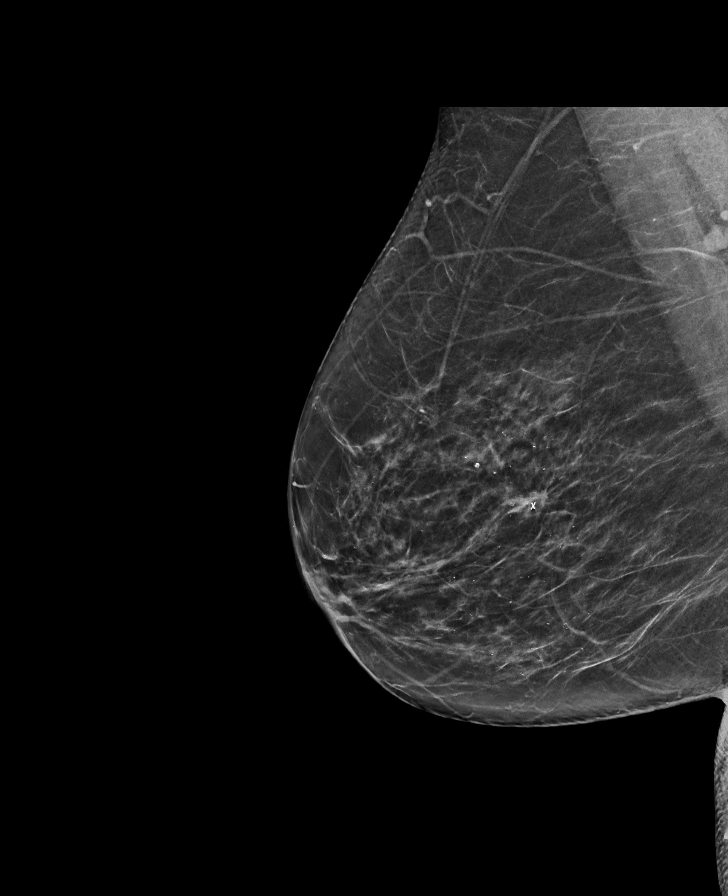

[L CC synth-2D]
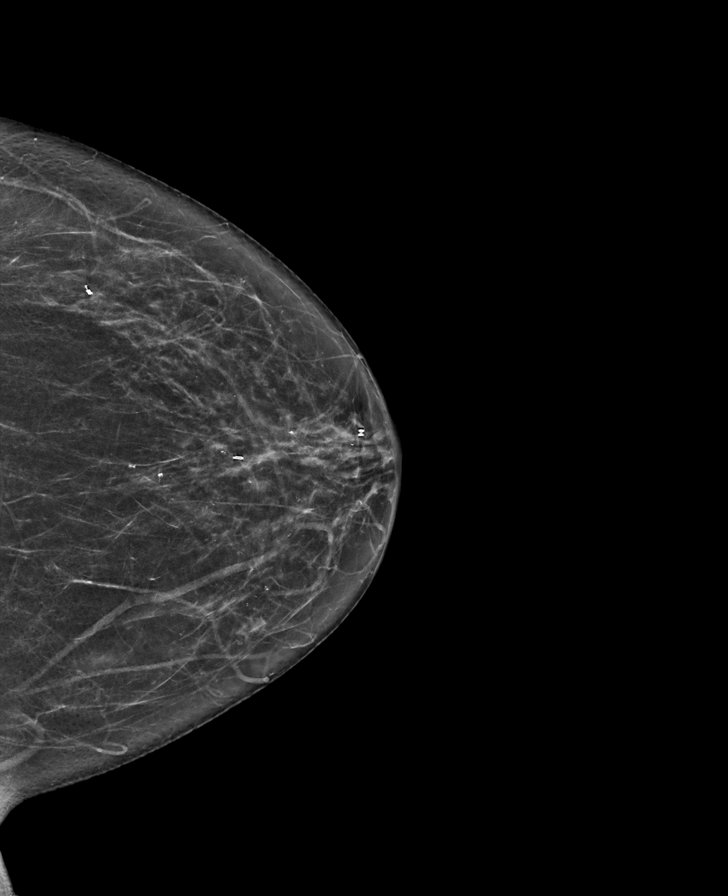

[R MLO tomo · tomo slice 35/69.0]
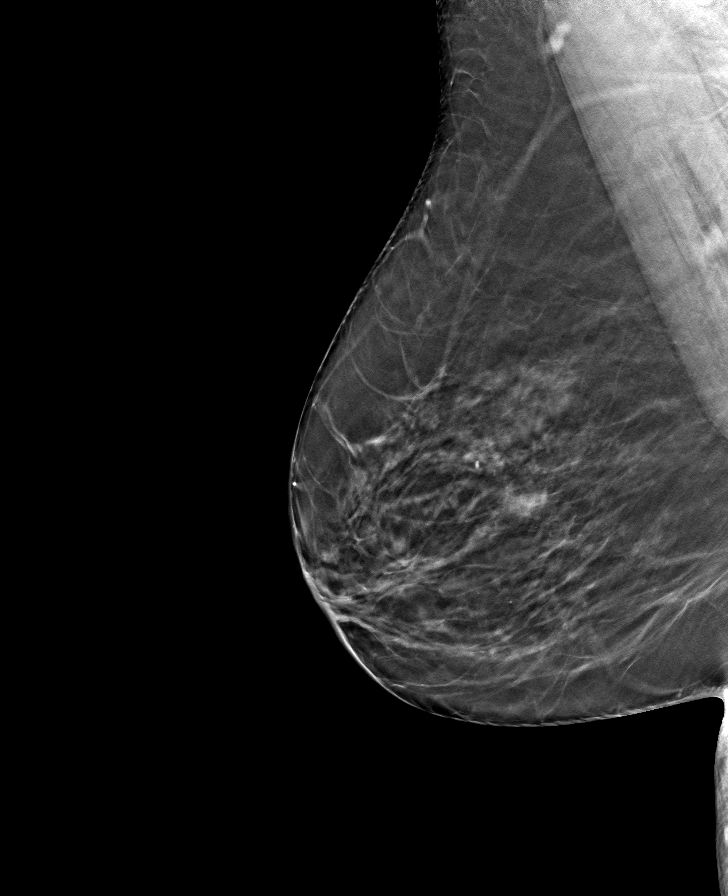

[L CC tomo · tomo slice 35/68.0]
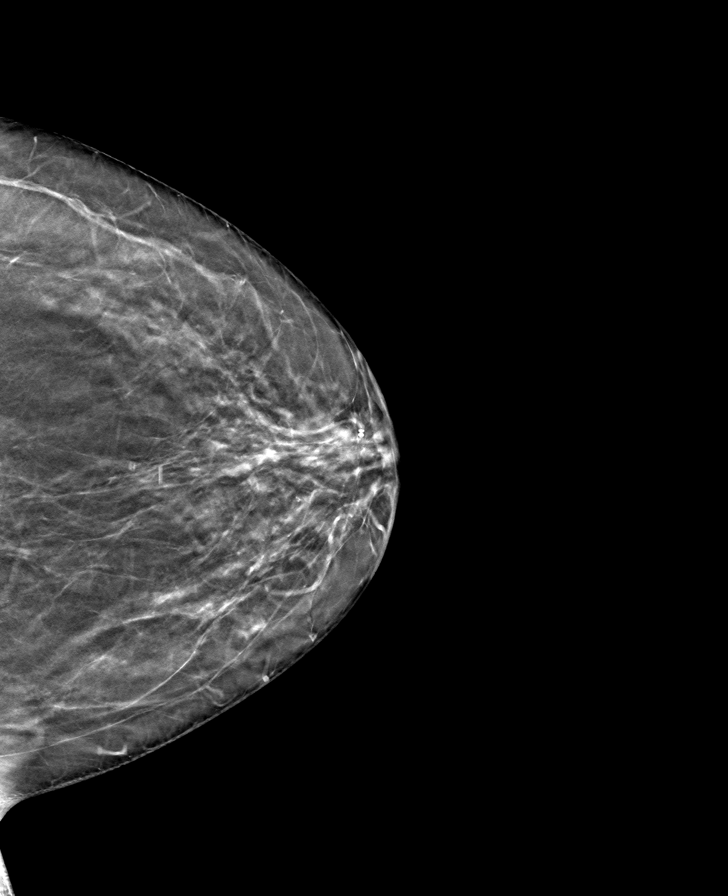

[L MLO tomo · tomo slice 34/67.0]
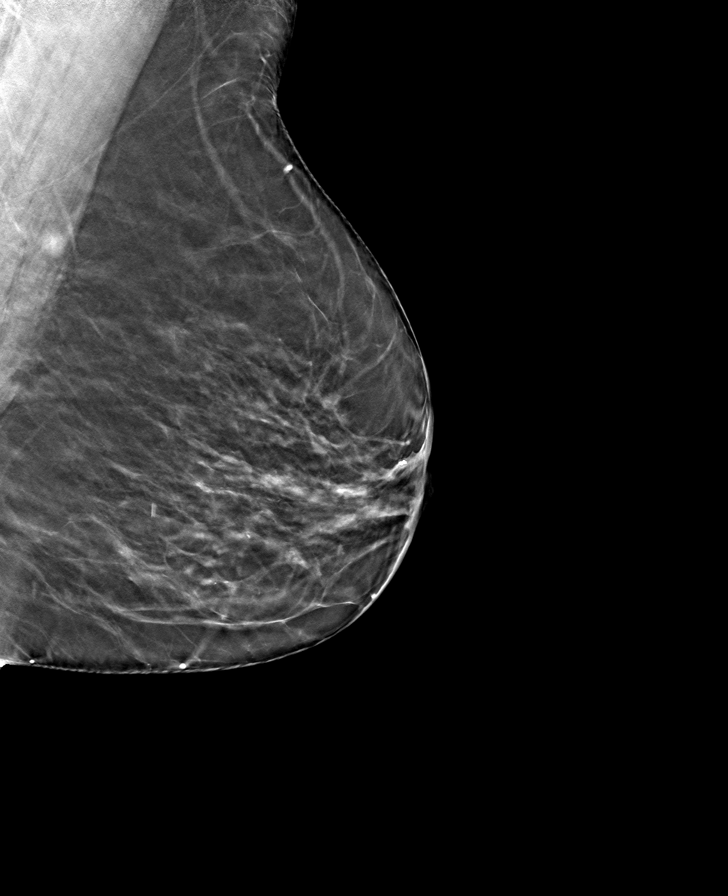

[R CC tomo · tomo slice 33/64.0]
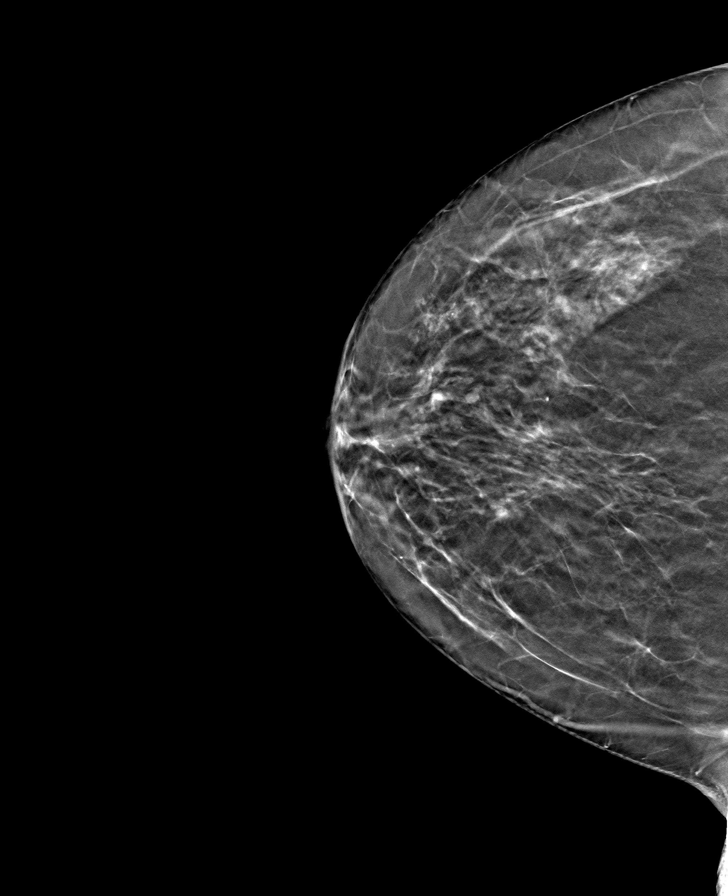

[8 of 24 positions shown; findings below may reference images not displayed]

ACR Breast Density Category b: There are scattered areas of
fibroglandular density.
FINDINGS: In the right breast, a possible mass warrants further evaluation. In
the left breast, no findings suspicious for malignancy. Images were
processed with CAD.
IMPRESSION: Further evaluation is suggested for possible mass in the right
breast.

RECOMMENDATION:
Diagnostic mammogram and possibly ultrasound of the right breast.
(Code:ED-9-NN7)

The patient will be contacted regarding the findings, and additional
imaging will be scheduled.

BI-RADS CATEGORY  0: Incomplete. Need additional imaging evaluation
and/or prior mammograms for comparison.

## 2021-02-21 ENCOUNTER — Encounter: Payer: Self-pay | Admitting: *Deleted

## 2021-04-25 ENCOUNTER — Ambulatory Visit (INDEPENDENT_AMBULATORY_CARE_PROVIDER_SITE_OTHER): Payer: Self-pay | Admitting: *Deleted

## 2021-04-25 VITALS — Ht 69.0 in | Wt 190.0 lb

## 2021-04-25 DIAGNOSIS — Z86010 Personal history of colonic polyps: Secondary | ICD-10-CM

## 2021-04-25 NOTE — Progress Notes (Addendum)
Gastroenterology Pre-Procedure Review ? ?Request Date: 04/25/2021 ?Requesting Physician: Dr. Hilma Favors, Last TCS 06/03/2017 by Dr. Oneida Alar, tubular adenoma, hyperplastic polyp ? ?PATIENT REVIEW QUESTIONS: The patient responded to the following health history questions as indicated:   ? ?1. Diabetes Melitis: no ?2. Joint replacements in the past 12 months: no ?3. Major health problems in the past 3 months: no ?4. Has an artificial valve or MVP: no ?5. Has a defibrillator: no ?6. Has been advised in past to take antibiotics in advance of a procedure like teeth cleaning: no ?7. Family history of colon cancer: no  ?8. Alcohol Use: no ?9. Illicit drug Use: no ?10. History of sleep apnea: no  ?11. History of coronary artery or other vascular stents placed within the last 12 months: no ?12. History of any prior anesthesia complications: no ?13. Body mass index is 28.06 kg/m?. ?   ?MEDICATIONS & ALLERGIES:    ?Patient reports the following regarding taking any blood thinners:   ?Plavix? no ?Aspirin? no ?Coumadin? no ?Brilinta? no ?Xarelto? no ?Eliquis? no ?Pradaxa? no ?Savaysa? no ?Effient? no ? ?Patient confirms/reports the following medications:  ?Current Outpatient Medications  ?Medication Sig Dispense Refill  ? Cholecalciferol (VITAMIN D3) 50 MCG (2000 UT) TABS Take by mouth at bedtime.    ? rosuvastatin (CRESTOR) 5 MG tablet Take 5 mg by mouth daily.    ? zolpidem (AMBIEN) 10 MG tablet Take 10 mg by mouth at bedtime as needed (for sleep.).     ? ?No current facility-administered medications for this visit.  ? ? ?Patient confirms/reports the following allergies:  ?Allergies  ?Allergen Reactions  ? Gadolinium Derivatives Other (See Comments)  ? ? ?No orders of the defined types were placed in this encounter. ? ? ?AUTHORIZATION INFORMATION ?Primary Insurance: [facility],  [place] #: [id number],  Group #: [id number] ?Pre-Cert / Josem Kaufmann required: No, not required ? ?SCHEDULE INFORMATION: ?Procedure has been scheduled as  follows:  ?Date: 05/18/2021, Time: 12:00 ?Location: [facility] with Dr. Abbey Chatters ? ?This Gastroenterology Pre-Precedure Review Form is being routed to the following provider(s): Venetia Night, NP ?  ?

## 2021-04-26 NOTE — Progress Notes (Signed)
Appropriate to schedule. ASA 2 ?

## 2021-04-30 ENCOUNTER — Encounter: Payer: Self-pay | Admitting: *Deleted

## 2021-04-30 MED ORDER — CLENPIQ 10-3.5-12 MG-GM -GM/160ML PO SOLN: 1.0000 | Freq: Once | ORAL | 0 refills | Status: AC

## 2021-04-30 NOTE — Progress Notes (Signed)
Spoke to pt.  Scheduled procedure for 05/18/2021 at 12:00, arrival 10:30 at [facility].  Reviewed prep instructions with pt by phone.  Pt aware to pick up prep kit at pharmacy.  She is aware that I am mailing out instructions.  Confirmed mailing address. ?

## 2021-04-30 NOTE — Addendum Note (Signed)
Addended by: Metro Kung on: 04/30/2021 11:42 AM ? ? Modules accepted: Orders ? ?

## 2021-05-17 ENCOUNTER — Telehealth: Payer: Self-pay | Admitting: Internal Medicine

## 2021-05-17 NOTE — Telephone Encounter (Signed)
Pt called me with questions about a message she received from [facility].  Provided pt with Pre-service Center's number. ?

## 2021-05-17 NOTE — Telephone Encounter (Signed)
Patient Wanted to speak to you about getting a call that she needed to pre pay for her tcs.   She said last time that did not happen  ?

## 2021-05-18 ENCOUNTER — Other Ambulatory Visit: Payer: Self-pay

## 2021-05-18 ENCOUNTER — Encounter (HOSPITAL_COMMUNITY)
Admission: RE | Disposition: A | Discharge status: Home / Self Care | Payer: Self-pay | Source: Ambulatory Visit | Attending: Internal Medicine

## 2021-05-18 ENCOUNTER — Ambulatory Visit (HOSPITAL_COMMUNITY): Payer: BC Managed Care – PPO | Admitting: Certified Registered"

## 2021-05-18 ENCOUNTER — Ambulatory Visit (HOSPITAL_COMMUNITY)
Admission: RE | Admit: 2021-05-18 | Discharge: 2021-05-18 | Disposition: A | Discharge status: Home / Self Care | Payer: BC Managed Care – PPO | Source: Ambulatory Visit | Attending: Internal Medicine | Admitting: Internal Medicine

## 2021-05-18 ENCOUNTER — Encounter (HOSPITAL_COMMUNITY): Payer: Self-pay

## 2021-05-18 DIAGNOSIS — Z87891 Personal history of nicotine dependence: Secondary | ICD-10-CM | POA: Diagnosis not present

## 2021-05-18 DIAGNOSIS — K648 Other hemorrhoids: Secondary | ICD-10-CM | POA: Diagnosis not present

## 2021-05-18 DIAGNOSIS — Z86010 Personal history of colonic polyps: Secondary | ICD-10-CM | POA: Insufficient documentation

## 2021-05-18 DIAGNOSIS — Z1211 Encounter for screening for malignant neoplasm of colon: Secondary | ICD-10-CM | POA: Diagnosis present

## 2021-05-18 DIAGNOSIS — K5730 Diverticulosis of large intestine without perforation or abscess without bleeding: Secondary | ICD-10-CM | POA: Insufficient documentation

## 2021-05-18 DIAGNOSIS — F419 Anxiety disorder, unspecified: Secondary | ICD-10-CM | POA: Insufficient documentation

## 2021-05-18 HISTORY — PX: COLONOSCOPY WITH PROPOFOL: SHX5780

## 2021-05-18 SURGERY — COLONOSCOPY WITH PROPOFOL
Anesthesia: Monitor Anesthesia Care

## 2021-05-18 MED ORDER — LACTATED RINGERS IV SOLN: INTRAVENOUS | Status: DC

## 2021-05-18 MED ORDER — PROPOFOL 500 MG/50ML IV EMUL
INTRAVENOUS | Status: DC | PRN
  Administered 2021-05-18: 200 ug/kg/min via INTRAVENOUS

## 2021-05-18 MED ORDER — LIDOCAINE 2% (20 MG/ML) 5 ML SYRINGE
INTRAMUSCULAR | Status: DC | PRN
  Administered 2021-05-18: 50 mg via INTRAVENOUS

## 2021-05-18 MED ORDER — PHENYLEPHRINE 80 MCG/ML (10ML) SYRINGE FOR IV PUSH (FOR BLOOD PRESSURE SUPPORT)
PREFILLED_SYRINGE | INTRAVENOUS | Status: DC | PRN
Start: 2021-05-18 — End: 2021-05-18
  Administered 2021-05-18: 160 ug via INTRAVENOUS

## 2021-05-18 MED ORDER — PROPOFOL 10 MG/ML IV BOLUS
INTRAVENOUS | Status: DC | PRN
Start: 2021-05-18 — End: 2021-05-18
  Administered 2021-05-18: 100 mg via INTRAVENOUS

## 2021-05-18 NOTE — Transfer of Care (Signed)
Immediate Anesthesia Transfer of Care Note ? ?Patient: Sarah Juarez ? ?Procedure(s) Performed: COLONOSCOPY WITH PROPOFOL ? ?Patient Location: Endoscopy Unit ? ?Anesthesia Type:General ? ?Level of Consciousness: awake, alert  and oriented ? ?Airway & Oxygen Therapy: Patient Spontanous Breathing ? ?Post-op Assessment: Report given to RN and Post -op Vital signs reviewed and stable ? ?Post vital signs: Reviewed and stable ? ?Last Vitals:  ?Vitals Value Taken Time  ?BP    ?Temp    ?Pulse    ?Resp    ?SpO2    ? ? ?Last Pain:  ?Vitals:  ? 05/18/21 1123  ?TempSrc:   ?PainSc: 0-No pain  ?   ? ?  ? ?Complications: No notable events documented. ?

## 2021-05-18 NOTE — Anesthesia Postprocedure Evaluation (Signed)
Anesthesia Post Note ? ?Patient: Sarah Juarez ? ?Procedure(s) Performed: COLONOSCOPY WITH PROPOFOL ? ?Patient location during evaluation: Endoscopy ?Anesthesia Type: General ?Level of consciousness: awake and alert and oriented ?Pain management: pain level controlled ?Vital Signs Assessment: post-procedure vital signs reviewed and stable ?Respiratory status: spontaneous breathing, nonlabored ventilation and respiratory function stable ?Cardiovascular status: blood pressure returned to baseline and stable ?Postop Assessment: no apparent nausea or vomiting ?Anesthetic complications: no ? ? ?No notable events documented. ? ? ?Last Vitals:  ?Vitals:  ? 05/18/21 1049 05/18/21 1139  ?BP:  (!) 92/49  ?Pulse: 72 75  ?Resp: 16 18  ?Temp: 37.1 ?C 36.4 ?C  ?SpO2: 96% 100%  ?  ?Last Pain:  ?Vitals:  ? 05/18/21 1139  ?TempSrc: Oral  ?PainSc: 0-No pain  ? ? ?  ?  ?  ?  ?  ?  ? ?Jimy Gates C Kyreese Chio ? ? ? ? ?

## 2021-05-18 NOTE — Anesthesia Preprocedure Evaluation (Addendum)
Anesthesia Evaluation  ?Patient identified by MRN, date of birth, ID band ?Patient awake ? ? ? ?Reviewed: ?Allergy & Precautions, NPO status , Patient's Chart, lab work & pertinent test results ? ?Airway ?Mallampati: II ? ?TM Distance: >3 FB ?Neck ROM: Full ? ? ? Dental ? ?(+) Dental Advisory Given, Upper Dentures, Missing ?  ?Pulmonary ?neg pulmonary ROS, former smoker,  ?  ?Pulmonary exam normal ?breath sounds clear to auscultation ? ? ? ? ? ? Cardiovascular ?negative cardio ROS ?Normal cardiovascular exam ?Rhythm:Regular Rate:Normal ? ? ?  ?Neuro/Psych ?PSYCHIATRIC DISORDERS Anxiety negative neurological ROS ?   ? GI/Hepatic ?negative GI ROS, Neg liver ROS,   ?Endo/Other  ?negative endocrine ROS ? Renal/GU ?negative Renal ROS  ?negative genitourinary ?  ?Musculoskeletal ?negative musculoskeletal ROS ?(+)  ? Abdominal ?  ?Peds ?negative pediatric ROS ?(+)  Hematology ?negative hematology ROS ?(+)   ?Anesthesia Other Findings ? ? Reproductive/Obstetrics ?negative OB ROS ? ?  ? ? ? ? ? ? ? ? ? ? ? ? ? ?  ?  ? ? ? ? ? ? ? ?Anesthesia Physical ?Anesthesia Plan ? ?ASA: 2 ? ?Anesthesia Plan:   ? ?Post-op Pain Management: Minimal or no pain anticipated  ? ?Induction: Intravenous ? ?PONV Risk Score and Plan: TIVA and Propofol infusion ? ?Airway Management Planned: Nasal Cannula and Natural Airway ? ?Additional Equipment:  ? ?Intra-op Plan:  ? ?Post-operative Plan:  ? ?Informed Consent: I have reviewed the patients History and Physical, chart, labs and discussed the procedure including the risks, benefits and alternatives for the proposed anesthesia with the patient or authorized representative who has indicated his/her understanding and acceptance.  ? ? ? ?Dental advisory given ? ?Plan Discussed with: CRNA and Surgeon ? ?Anesthesia Plan Comments:   ? ? ? ? ? ? ?Anesthesia Quick Evaluation ? ?

## 2021-05-18 NOTE — Op Note (Signed)
[facility] ?Patient Name: Sarah Juarez ?Procedure Date: 05/18/2021 11:17 AM ?MRN: [id number] ?Date of Birth: 10-25-1960 ?Attending MD: Elon Alas. Abbey Chatters , DO ?CSN: [id number] ?Age: 62 ?Admit Type: Outpatient ?Procedure:                Colonoscopy ?Indications:              Surveillance: Personal history of adenomatous  ?                          polyps on last colonoscopy > 3 years ago ?Providers:                Elon Alas. Abbey Chatters, DO, Caprice Kluver, [place] Page ?Referring MD:              ?Medicines:                See the Anesthesia note for documentation of the  ?                          administered medications ?Complications:            No immediate complications. ?Estimated Blood Loss:     Estimated blood loss: none. ?Procedure:                Pre-Anesthesia Assessment: ?                          - The anesthesia plan was to use monitored  ?                          anesthesia care (MAC). ?                          After obtaining informed consent, the colonoscope  ?                          was passed under direct vision. Throughout the  ?                          procedure, the patient's blood pressure, pulse, and  ?                          oxygen saturations were monitored continuously. The  ?                          PCF-HQ190L ([id number]) scope was introduced through  ?                          the anus and advanced to the the cecum, identified  ?                          by appendiceal orifice and ileocecal valve. The  ?                          colonoscopy was performed without difficulty. The  ?                          patient  tolerated the procedure well. The quality  ?                          of the bowel preparation was evaluated using the  ?                          BBPS [facility] Bowel Preparation Scale) with scores  ?                          of: Right Colon = 3, Transverse Colon = 3 and Left  ?                          Colon = 3 (entire mucosa seen well with no residual  ?                           staining, small fragments of stool or opaque  ?                          liquid). The total BBPS score equals 9. ?Scope In: 11:27:09 AM ?Scope Out: 11:37:29 AM ?Scope Withdrawal Time: 0 hours 7 minutes 27 seconds  ?Total Procedure Duration: 0 hours 10 minutes 20 seconds  ?Findings: ?     The perianal and digital rectal examinations were normal. ?     Non-bleeding internal hemorrhoids were found during endoscopy. ?     Multiple small and large-mouthed diverticula were found in the sigmoid  ?     colon and descending colon. ?     The exam was otherwise without abnormality. ?Impression:               - Non-bleeding internal hemorrhoids. ?                          - Diverticulosis in the sigmoid colon and in the  ?                          descending colon. ?                          - The examination was otherwise normal. ?                          - No specimens collected. ?Moderate Sedation: ?     Per Anesthesia Care ?Recommendation:           - Patient has a contact number available for  ?                          emergencies. The signs and symptoms of potential  ?                          delayed complications were discussed with the  ?                          patient. Return to normal activities tomorrow.  ?  Written discharge instructions were provided to the  ?                          patient. ?                          - Resume previous diet. ?                          - Continue present medications. ?                          - Repeat colonoscopy in 5 years for surveillance. ?                          - Return to GI clinic PRN. ?Procedure Code(s):        --- Professional --- ?                          G0105, Colorectal cancer screening; colonoscopy on  ?                          individual at high risk ?Diagnosis Code(s):        --- Professional --- ?                          Z86.010, Personal history of colonic polyps ?                          K64.8, Other hemorrhoids ?                           K57.30, Diverticulosis of large intestine without  ?                          perforation or abscess without bleeding ?CPT copyright 2019 American Medical Association. All rights reserved. ?The codes documented in this report are preliminary and upon coder review may  ?be revised to meet current compliance requirements. ?Elon Alas. Abbey Chatters, DO ?Elon Alas. [place], DO ?05/18/2021 11:40:55 AM ?This report has been signed electronically. ?Number of Addenda: 0 ?

## 2021-05-18 NOTE — H&P (Signed)
Primary Care Physician:  Sharilyn Sites, MD ?Primary Gastroenterologist:  Dr. Abbey Chatters ? ?Pre-Procedure History & Physical: ?HPI:  Sarah Juarez is a 61 y.o. female is here for a colonoscopy to be performed for surveillance purposes, personal history of adenomatous colon polyps 2019. ? ?Past Medical History:  ?Diagnosis Date  ? Anxiety   ? Insomnia   ? ? ?Past Surgical History:  ?Procedure Laterality Date  ? BREAST BIOPSY Bilateral   ? benign  ? CERVICAL SPINE SURGERY  04/2016  ? COLONOSCOPY WITH PROPOFOL N/A 06/03/2017  ? Procedure: COLONOSCOPY WITH PROPOFOL;  Surgeon: Danie Binder, MD;  Location: [facility] SUITE;  Service: Endoscopy;  Laterality: N/A;  9:15am  ? POLYPECTOMY  06/03/2017  ? Procedure: POLYPECTOMY;  Surgeon: Danie Binder, MD;  Location: [facility] SUITE;  Service: Endoscopy;;  colon  ? ? ?Prior to Admission medications   ?Medication Sig Start Date End Date Taking? Authorizing Provider  ?albuterol (VENTOLIN HFA) 108 (90 Base) MCG/ACT inhaler 2 puffs every 4 (four) hours as needed for wheezing. 11/27/20  Yes [provider]  ?Cholecalciferol (VITAMIN D3) 50 MCG (2000 UT) TABS Take 2,000 Units by mouth at bedtime.   Yes [provider]  ?diphenhydrAMINE (BENADRYL) 25 MG tablet Take 25 mg by mouth every 6 (six) hours as needed for allergies.   Yes [provider]  ?rosuvastatin (CRESTOR) 5 MG tablet Take 5 mg by mouth daily. 04/24/21  Yes [provider]  ?zolpidem (AMBIEN) 10 MG tablet Take 10 mg by mouth at bedtime as needed for sleep.   Yes [provider]  ? ? ?Allergies as of 04/30/2021 - Review Complete 04/25/2021  ?Allergen Reaction Noted  ? Gadolinium derivatives Other (See Comments) 09/12/2019  ? ? ?Family History  ?Problem Relation Age of Onset  ? Breast cancer Paternal Aunt   ? Breast cancer Paternal Aunt   ? Colon cancer Neg Hx   ? Gastric cancer Neg Hx   ? Esophageal cancer Neg Hx   ? ? ?Social History  ? ?Socioeconomic History  ? Marital  status: Single  ?  Spouse name: Not on file  ? Number of children: Not on file  ? Years of education: Not on file  ? Highest education level: Not on file  ?Occupational History  ? Not on file  ?Tobacco Use  ? Smoking status: Former  ?  Packs/day: 0.50  ?  Types: Cigarettes  ? Smokeless tobacco: Never  ? Tobacco comments:  ?  less than a pack daily  ?Vaping Use  ? Vaping Use: Never used  ?Substance and Sexual Activity  ? Alcohol use: No  ? Drug use: No  ? Sexual activity: Not on file  ?Other Topics Concern  ? Not on file  ?Social History Narrative  ? Not on file  ? ?Social Determinants of Health  ? ?Financial Resource Strain: Not on file  ?Food Insecurity: Not on file  ?Transportation Needs: Not on file  ?Physical Activity: Not on file  ?Stress: Not on file  ?Social Connections: Not on file  ?Intimate Partner Violence: Not on file  ? ? ?Review of Systems: ?See HPI, otherwise negative ROS ? ?Physical Exam: ?Vital signs in last 24 hours: ?Temp:  [98.7 ?F (37.1 ?C)] 98.7 ?F (37.1 ?C) (05/05 1049) ?Pulse Rate:  [72] 72 (05/05 1049) ?Resp:  [16] 16 (05/05 1049) ?SpO2:  [96 %] 96 % (05/05 1049) ?Weight:  [86.2 kg] 86.2 kg (05/05 1049) ?  ?General:   Alert,  Well-developed, well-nourished, pleasant and cooperative in NAD ?Head:  Normocephalic and atraumatic. ?Eyes:  Sclera clear, no icterus.   Conjunctiva pink. ?Ears:  Normal auditory acuity. ?Nose:  No deformity, discharge,  or lesions. ?Mouth:  No deformity or lesions, dentition normal. ?Neck:  Supple; no masses or thyromegaly. ?Lungs:  Clear throughout to auscultation.   No wheezes, crackles, or rhonchi. No acute distress. ?Heart:  Regular rate and rhythm; no murmurs, clicks, rubs,  or gallops. ?Abdomen:  Soft, nontender and nondistended. No masses, hepatosplenomegaly or hernias noted. Normal bowel sounds, without guarding, and without rebound.   ?Msk:  Symmetrical without gross deformities. Normal posture. ?Extremities:  Without clubbing or edema. ?Neurologic:  Alert  and  oriented x4;  grossly normal neurologically. ?Skin:  Intact without significant lesions or rashes. ?Cervical Nodes:  No significant cervical adenopathy. ?Psych:  Alert and cooperative. Normal mood and affect. ? ?Impression/Plan: ?Sarah Juarez is here for a colonoscopy to be performed for surveillance purposes, personal history of adenomatous colon polyps 2019. ? ?The risks of the procedure including infection, bleed, or perforation as well as benefits, limitations, alternatives and imponderables have been reviewed with the patient. Questions have been answered. All parties agreeable. ? ? ?

## 2021-05-18 NOTE — Discharge Instructions (Addendum)
?  Colonoscopy Discharge Instructions  Read the instructions outlined below and refer to this sheet in the next few weeks. These discharge instructions provide you with general information on caring for yourself after you leave the hospital. Your doctor may also give you specific instructions. While your treatment has been planned according to the most current medical practices available, unavoidable complications occasionally occur.   ACTIVITY You may resume your regular activity, but move at a slower pace for the next 24 hours.  Take frequent rest periods for the next 24 hours.  Walking will help get rid of the air and reduce the bloated feeling in your belly (abdomen).  No driving for 24 hours (because of the medicine (anesthesia) used during the test).   Do not sign any important legal documents or operate any machinery for 24 hours (because of the anesthesia used during the test).  NUTRITION Drink plenty of fluids.  You may resume your normal diet as instructed by your doctor.  Begin with a light meal and progress to your normal diet. Heavy or fried foods are harder to digest and may make you feel sick to your stomach (nauseated).  Avoid alcoholic beverages for 24 hours or as instructed.  MEDICATIONS You may resume your normal medications unless your doctor tells you otherwise.  WHAT YOU CAN EXPECT TODAY Some feelings of bloating in the abdomen.  Passage of more gas than usual.  Spotting of blood in your stool or on the toilet paper.  IF YOU HAD POLYPS REMOVED DURING THE COLONOSCOPY: No aspirin products for 7 days or as instructed.  No alcohol for 7 days or as instructed.  Eat a soft diet for the next 24 hours.  FINDING OUT THE RESULTS OF YOUR TEST Not all test results are available during your visit. If your test results are not back during the visit, make an appointment with your caregiver to find out the results. Do not assume everything is normal if you have not heard from your  caregiver or the medical facility. It is important for you to follow up on all of your test results.  SEEK IMMEDIATE MEDICAL ATTENTION IF: You have more than a spotting of blood in your stool.  Your belly is swollen (abdominal distention).  You are nauseated or vomiting.  You have a temperature over 101.  You have abdominal pain or discomfort that is severe or gets worse throughout the day.   Your colonoscopy was relatively unremarkable.  I did not find any polyps or evidence of colon cancer.  I recommend repeating colonoscopy in 5 years for surveillance purposes..  You do have diverticulosis and internal hemorrhoids. I would recommend increasing fiber in your diet or adding OTC Benefiber/Metamucil. Be sure to drink at least 4 to 6 glasses of water daily. Follow-up with GI as needed.   I hope you have a great rest of your week!  Charles K. Carver, D.O. Gastroenterology and Hepatology [facility]  

## 2021-05-28 ENCOUNTER — Encounter (HOSPITAL_COMMUNITY): Payer: Self-pay | Admitting: Internal Medicine

## 2022-08-05 ENCOUNTER — Encounter: Payer: Self-pay | Admitting: Internal Medicine

## 2022-08-05 ENCOUNTER — Other Ambulatory Visit: Payer: Self-pay

## 2022-08-05 ENCOUNTER — Ambulatory Visit: Payer: BC Managed Care – PPO | Admitting: Internal Medicine

## 2022-08-05 VITALS — BP 98/64 | HR 82 | Temp 98.0°F | Resp 20 | Ht 67.48 in | Wt 197.2 lb

## 2022-08-05 DIAGNOSIS — J3089 Other allergic rhinitis: Secondary | ICD-10-CM | POA: Diagnosis not present

## 2022-08-05 DIAGNOSIS — L299 Pruritus, unspecified: Secondary | ICD-10-CM

## 2022-08-05 DIAGNOSIS — J3081 Allergic rhinitis due to animal (cat) (dog) hair and dander: Secondary | ICD-10-CM | POA: Diagnosis not present

## 2022-08-05 DIAGNOSIS — J343 Hypertrophy of nasal turbinates: Secondary | ICD-10-CM

## 2022-08-05 DIAGNOSIS — J301 Allergic rhinitis due to pollen: Secondary | ICD-10-CM | POA: Diagnosis not present

## 2022-08-05 DIAGNOSIS — R053 Chronic cough: Secondary | ICD-10-CM | POA: Diagnosis not present

## 2022-08-05 MED ORDER — AZELASTINE HCL 0.1 % NA SOLN: 1.0000 | Freq: Two times a day (BID) | NASAL | 5 refills | Status: AC | PRN

## 2022-08-05 MED ORDER — CETIRIZINE HCL 10 MG PO TABS: 10.0000 mg | ORAL_TABLET | Freq: Two times a day (BID) | ORAL | 5 refills | Status: AC

## 2022-08-05 NOTE — Patient Instructions (Addendum)
Pruritus - Unclear etiology - Do a daily soaking tub bath in warm water for 10-15 minutes.  - Use a gentle, unscented cleanser at the end of the bath (such as Dove unscented bar or baby wash, or Aveeno sensitive body wash). Then rinse, pat half-way dry, and apply a gentle, unscented moisturizer cream or ointment (Cerave, Cetaphil, Eucerin, Aveeno)  all over while still damp. Dry skin makes the itching worse. The skin should be moisturized with a gentle, unscented moisturizer at least twice daily.  - Use only unscented liquid laundry detergent. - Will also check CBC with diff, CMP (renal/liver function) and thyroid function. - Use Zyrtec 10mg  twice daily as needed for itching.     Allergic Rhinitis: - Positive skin test 07/2022: grasses, weeds, trees, molds, cats, dogs, dust mite, feathers  - Avoidance measures discussed. - Use nasal saline rinses before nose sprays such as with Neilmed Sinus Rinse.  Use distilled water.   - Use Azelastine 1-2 sprays each nostril twice daily as needed for runny nose, drainage, sneezing, congestion. Aim upward and outward. - Use Zyrtec (Cetirizine) 10 mg daily.  - Consider allergy shots as long term control of your symptoms by teaching your immune system to be more tolerant of your allergy triggers.

## 2022-08-05 NOTE — Progress Notes (Signed)
NEW PATIENT  Date of Service/Encounter:  08/09/22  Consult requested by: Assunta Found, MD   Subjective:   Sarah Juarez (DOB: [birth year]) is a 62 y.o. female who presents to the clinic on 08/05/2022 with a chief complaint of Allergic Rhinitis  (Says that her PCP sent over skin testing. ) .    History obtained from: chart review and patient.   Pruritus: Started 2 years ago.   Reports having diffuse itching without a clear rash but does get red blotchy sometimes especially on neck.  Does have a cat and has noticed it bothers her skin so she tries to avoid hugging the cat.  Does fine with dogs.   It has worsened since having COVID. Randomly occurs and sometimes with use of plugins.   Tried Claritin and Benadryl without much relief. Now using Zyrtec 10mg  daily and it helps.   Using lotion-cocoa butter to moisturize. No hives/eczema.  Her PCP did a blood test for aeroallergens that was positive to multiple environmentals and foods. She does not believe this is food induced and has not noticed any correlation with itching.    SOB/Cough: Previously had trouble with cough/SOB but improved after quitting in 2023.  No childhood history of asthma.  Sometimes but not sure exact daytime symptoms in past month, none nighttime awakenings in past month Using rescue inhaler: rarely  Limitations to daily activity: none 0 ED visits/UC visits and 0 oral steroids in the past year 0 number of lifetime hospitalizations, 0 number of lifetime intubations.  Identified Triggers: allergies  Prior PFTs or spirometry: none available for review but recalls doing them  Previously used therapies: none  Current regimen:  Maintenance: none Rescue: Albuterol 2 puffs q4-6 hrs PRN  Rhinitis:  Started years ago but worsened with COVID.  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    Occurs year-round Potential triggers: pets   Treatments tried:  Zyrtec daily  Previous allergy testing: yes History of reflux/heartburn: none History of sinus surgery: none Nonallergic triggers: none    Past Medical History: Past Medical History:  Diagnosis Date   Anxiety    Insomnia     Past Surgical History: Past Surgical History:  Procedure Laterality Date   BREAST BIOPSY Bilateral    benign   CERVICAL SPINE SURGERY  04/2016   COLONOSCOPY WITH PROPOFOL N/A 06/03/2017   Procedure: COLONOSCOPY WITH PROPOFOL;  Surgeon: West Bali, MD;  Location: [facility];  Service: Endoscopy;  Laterality: N/A;  9:15am   COLONOSCOPY WITH PROPOFOL N/A 05/18/2021   Procedure: COLONOSCOPY WITH PROPOFOL;  Surgeon: Lanelle Bal, DO;  Location: [facility];  Service: Endoscopy;  Laterality: N/A;  12:00 / ASA 2   NECK SURGERY  2018   POLYPECTOMY  06/03/2017   Procedure: POLYPECTOMY;  Surgeon: West Bali, MD;  Location: [facility];  Service: Endoscopy;;  colon    Family History: Family History  Problem Relation Age of Onset   Breast cancer Paternal Aunt    Breast cancer Paternal Aunt    Colon cancer Neg Hx    Gastric cancer Neg Hx    Esophageal cancer Neg Hx     Social History:  Flooring in bedroom: carpet Pets: cat Tobacco use/exposure: prior smoker Job: [employer] tech  Medication List:  Allergies as of 08/05/2022       Reactions   Gadolinium Derivatives Nausea And Vomiting  Medication List        Accurate as of August 05, 2022 11:59 PM. If you have any questions, ask your nurse or doctor.          STOP taking these medications    diphenhydrAMINE 25 MG tablet Commonly known as: BENADRYL Stopped by: Birder Robson       TAKE these medications    albuterol 108 (90 Base) MCG/ACT inhaler Commonly known as: VENTOLIN HFA 2 puffs every 4 (four) hours as needed for wheezing.   azelastine 0.1 % nasal spray Commonly known as: ASTELIN Place 1 spray into both nostrils 2 (two) times  daily as needed. Use in each nostril as directed Started by: Birder Robson   cetirizine 10 MG tablet Commonly known as: ZyrTEC Allergy Take 1 tablet (10 mg total) by mouth in the morning and at bedtime. Started by: Birder Robson   Fluocinolone Acetonide 0.01 % Oil Place 4 drops in ear(s) 2 (two) times daily as needed.   rosuvastatin 5 MG tablet Commonly known as: CRESTOR Take 5 mg by mouth daily.   Vitamin D (Ergocalciferol) 1.25 MG (50000 UNIT) Caps capsule Commonly known as: DRISDOL Take 50,000 Units by mouth every 7 (seven) days.   Vitamin D3 50 MCG (2000 UT) Tabs Take 2,000 Units by mouth at bedtime.   zolpidem 10 MG tablet Commonly known as: AMBIEN Take 10 mg by mouth at bedtime as needed for sleep.         REVIEW OF SYSTEMS: Pertinent positives and negatives discussed in HPI.   Objective:   Physical Exam: BP 98/64   Pulse 82   Temp 98 F (36.7 C) (Temporal)   Resp 20   Ht 5' 7.48" (1.714 m)   Wt 197 lb 3.2 oz (89.4 kg)   SpO2 98%   BMI 30.45 kg/m  Body mass index is 30.45 kg/m. GEN: alert, well developed HEENT: clear conjunctiva, TM grey and translucent, nose with + inferior turbinate hypertrophy, pink nasal mucosa, slight clear rhinorrhea, + cobblestoning HEART: regular rate and rhythm, no murmur LUNGS: clear to auscultation bilaterally, no coughing, unlabored respiration ABDOMEN: soft, non distended  SKIN: no rashes or lesions  Reviewed:  06/14/2022: seen by PCP for pruritus; sIgE postive to DM, cat, dog, grasses,  trees, weeds. Also mild reactivity to peanut, soy, milk, wheat, corn, sesame.   03/2022: seen by [facility] for back pain and hip pain, on medrol dose pack for left lumbar radiculitis. Does have HLD and low Vit D.   12/21/2020: seen by [facility] for ear itching. Thought to be related to cerumen.  Use baby oil drops regularly and derm otic PRN.   Spirometry:  Tracings reviewed. Her effort: Good reproducible efforts. FVC:  2.81L FEV1: 2.10L, 77% predicted FEV1/FVC ratio: 75% Interpretation: Spirometry consistent with normal pattern.  Please see scanned spirometry results for details.  Skin Testing:  Skin prick testing was placed, which includes aeroallergens/foods, histamine control, and saline control.  Verbal consent was obtained prior to placing test.  Patient tolerated procedure well.  Allergy testing results were read and interpreted by myself, documented by clinical staff. Adequate positive and negative control.  Results discussed with patient/family.  [employer] Adult [employer] Name 08/05/22 1500  Test Information  Time Antigen Placed 1539  Allergen Manufacturer Greer  Location Back  Number of Test 55  Routine  1. Control-Buffer 50% Glycerol Negative  2. Control-Histamine 3+  Grasses  3. Bahia Negative  4. [place] 3+  5. Johnson Negative  6. Kentucky Blue Negative  7. Meadow Fescue 3+  8. Perennial Rye 3+  9. Timothy 3+  Weeds  10. Ragweed Mix 3+  11. Cocklebur Negative  12. Plantain,  English Negative  13. Baccharis 3+  14. Dog Fennel Negative  15. Russian Thistle Negative  16. Lamb's Quarters Negative  17. Sheep Sorrell Negative  18. Rough Pigweed Negative  19. Marsh Elder, Rough 3+  20. Mugwort, Common Negative  Trees  21. Box, Elder Negative  22. Cedar, red Negative  23. Sweet Gum Negative  24. Pecan Pollen Negative  25. Pine Mix Negative  26. Walnut, Black Pollen Negative  27. Red Mulberry Negative  28. Ash Mix Negative  29. Birch Mix Negative  30. Beech American Negative  31. Cottonwood, Guinea-Bissau Negative  32. Hickory, White 3+  33. Maple Mix Negative  34. Oak, Guinea-[place] Mix Negative  35. Sycamore Eastern Negative  Major Molds Mix (seasonal) 1  36. Alternaria Alternata Negative  37. Cladosporium Herbarum Negative  Major Molds Mix (perennial ) 2  38. Aspergillus Mix Negative  39. Penicillium Mix Negative  Major Mold Mix (seasonal) 3  40. Bipolaris Sorokiniana  (Helminthosporium) Negative  41. Drechslera Spicifera (Curvularia) Negative  42. Mucor Plumbeus Negative  Major Molds Mix (perennial ) 4  43. Fusarium Moniliforme Negative  44. Aureobasidium Pullulans (pullulara) 3+  45. Rhizopus Oryzae 3+  Other Molds  46. Botrytis Cinera Negative  47. Epicoccum Nigrum Negative  48. Phoma Betae Negative  Inhalants  49. Dust Mite Mix 3+  50. Cat Hair 10,000 BAU/ml 2+  51.  Dog Epithelia 3+  52. Mixed Feathers 3+  53. Horse Epithelia Negative  54. Cockroach, German Negative  55. Tobacco Leaf Negative      Assessment:   1. Pruritus   2. Chronic cough   3. Nasal turbinate hypertrophy   4. Seasonal allergic rhinitis due to pollen   5. Allergic rhinitis caused by mold   6. Allergic rhinitis due to animal hair or dander   7. Allergic rhinitis due to dust mite     Plan/Recommendations:  Pruritus - Do a daily soaking tub bath in warm water for 10-15 minutes.  - Use a gentle, unscented cleanser at the end of the bath (such as Dove unscented bar or baby wash, or Aveeno sensitive body wash). Then rinse, pat half-way dry, and apply a gentle, unscented moisturizer cream or ointment (Cerave, Cetaphil, Eucerin, Aveeno)  all over while still damp. Dry skin makes the itching worse. The skin should be moisturized with a gentle, unscented moisturizer at least twice daily.  - Use only unscented liquid laundry detergent. - Will also check CBC with diff, CMP (renal/liver function) and thyroid function. - Use Zyrtec 10mg  twice daily as needed for itching.    Allergic Rhinitis: - Due to turbinate hypertrophy, seasonal symptoms and unresponsive to over the counter meds, performed skin testing to identify aeroallergen triggers.   - Positive skin test 07/2022: grasses, weeds, trees, molds, cats, dogs, dust mite, feathers  - Avoidance measures discussed. - Use nasal saline rinses before nose sprays such as with Neilmed Sinus Rinse.  Use distilled water.   - Use  Azelastine 1-2 sprays each nostril twice daily as needed for runny nose, drainage, sneezing, congestion. Aim upward and outward. - Use Zyrtec (Cetirizine) 10 mg daily.  - Consider allergy shots as long term control of your symptoms by teaching your immune system to be more tolerant of your allergy triggers.  Cough, Dyspnea -  Spirometry today was normal.  - Okay to keep Albuterol 1-2 puffs as needed for wheezing, shortness of breath, cough.  Keep track of use and symptoms.   Return in about 6 weeks (around 09/16/2022).  Alesia Morin, MD [facility]

## 2022-09-30 ENCOUNTER — Ambulatory Visit: Payer: BC Managed Care – PPO | Admitting: Internal Medicine

## 2022-09-30 DIAGNOSIS — J309 Allergic rhinitis, unspecified: Secondary | ICD-10-CM

## 2023-02-19 ENCOUNTER — Other Ambulatory Visit (HOSPITAL_COMMUNITY): Payer: Self-pay | Admitting: Family Medicine

## 2023-02-19 DIAGNOSIS — Z1231 Encounter for screening mammogram for malignant neoplasm of breast: Secondary | ICD-10-CM

## 2023-04-17 ENCOUNTER — Ambulatory Visit (HOSPITAL_COMMUNITY)
Admission: RE | Admit: 2023-04-17 | Discharge: 2023-04-17 | Disposition: A | Discharge status: Home / Self Care | Source: Ambulatory Visit | Attending: Family Medicine | Admitting: Family Medicine

## 2023-04-17 ENCOUNTER — Other Ambulatory Visit (HOSPITAL_COMMUNITY): Payer: Self-pay | Admitting: Family Medicine

## 2023-04-17 DIAGNOSIS — M5432 Sciatica, left side: Secondary | ICD-10-CM | POA: Insufficient documentation

## 2023-04-22 ENCOUNTER — Other Ambulatory Visit: Payer: Self-pay | Admitting: Family Medicine

## 2023-04-22 DIAGNOSIS — R928 Other abnormal and inconclusive findings on diagnostic imaging of breast: Secondary | ICD-10-CM

## 2023-05-05 ENCOUNTER — Ambulatory Visit

## 2023-05-05 ENCOUNTER — Ambulatory Visit
Admission: RE | Admit: 2023-05-05 | Discharge: 2023-05-05 | Disposition: A | Discharge status: Home / Self Care | Source: Ambulatory Visit | Attending: Family Medicine | Admitting: Family Medicine

## 2023-05-05 DIAGNOSIS — R928 Other abnormal and inconclusive findings on diagnostic imaging of breast: Secondary | ICD-10-CM

## 2023-07-10 ENCOUNTER — Other Ambulatory Visit (HOSPITAL_COMMUNITY): Payer: Self-pay | Admitting: Family Medicine

## 2023-07-10 ENCOUNTER — Ambulatory Visit (HOSPITAL_COMMUNITY)
Admission: RE | Admit: 2023-07-10 | Discharge: 2023-07-10 | Disposition: A | Discharge status: Home / Self Care | Source: Ambulatory Visit | Attending: Family Medicine | Admitting: Family Medicine

## 2023-07-10 DIAGNOSIS — M25562 Pain in left knee: Secondary | ICD-10-CM | POA: Insufficient documentation

## 2023-08-01 ENCOUNTER — Encounter: Payer: Self-pay | Admitting: Advanced Practice Midwife
